# Patient Record
Sex: Female | Born: 1990 | Race: Black or African American | Hispanic: No | Marital: Single
Health system: Midwestern US, Community
[De-identification: ages and names within clinical notes are randomized; demographics above are authoritative.]

## PROBLEM LIST (undated history)

## (undated) DIAGNOSIS — N159 Renal tubulo-interstitial disease, unspecified: Secondary | ICD-10-CM

## (undated) DIAGNOSIS — I10 Essential (primary) hypertension: Secondary | ICD-10-CM

## (undated) DIAGNOSIS — K859 Acute pancreatitis without necrosis or infection, unspecified: Secondary | ICD-10-CM

## (undated) HISTORY — PX: CHOLECYSTECTOMY: SHX55

---

## 2014-04-07 LAB — URINE MICROSCOPIC ONLY
RBC: 0 /hpf (ref 0–5)
WBC: 0 /hpf (ref 0–5)

## 2014-04-07 LAB — CBC WITH AUTOMATED DIFF
ABS. BASOPHILS: 0 10*3/uL (ref 0.0–0.06)
ABS. EOSINOPHILS: 0 10*3/uL (ref 0.0–0.4)
ABS. LYMPHOCYTES: 0.7 10*3/uL — ABNORMAL LOW (ref 0.9–3.6)
ABS. MONOCYTES: 0.7 10*3/uL (ref 0.05–1.2)
ABS. NEUTROPHILS: 14 10*3/uL — ABNORMAL HIGH (ref 1.8–8.0)
BASOPHILS: 0 % (ref 0–2)
EOSINOPHILS: 0 % (ref 0–5)
HCT: 40.7 % (ref 35.0–45.0)
HGB: 13.7 g/dL (ref 12.0–16.0)
LYMPHOCYTES: 5 % — ABNORMAL LOW (ref 21–52)
MCH: 26.8 PG (ref 24.0–34.0)
MCHC: 33.7 g/dL (ref 31.0–37.0)
MCV: 79.6 FL (ref 74.0–97.0)
MONOCYTES: 5 % (ref 3–10)
MPV: 9.6 FL (ref 9.2–11.8)
NEUTROPHILS: 90 % — ABNORMAL HIGH (ref 40–73)
PLATELET: 255 10*3/uL (ref 135–420)
RBC: 5.11 M/uL (ref 4.20–5.30)
RDW: 13.5 % (ref 11.6–14.5)
WBC: 15.4 10*3/uL — ABNORMAL HIGH (ref 4.6–13.2)

## 2014-04-07 LAB — METABOLIC PANEL, COMPREHENSIVE
A-G Ratio: 1 (ref 0.8–1.7)
ALT (SGPT): 19 U/L (ref 13–56)
AST (SGOT): 14 U/L — ABNORMAL LOW (ref 15–37)
Albumin: 4 g/dL (ref 3.4–5.0)
Alk. phosphatase: 77 U/L (ref 45–117)
Anion gap: 15 mmol/L (ref 3.0–18)
BUN/Creatinine ratio: 13 (ref 12–20)
BUN: 13 MG/DL (ref 7.0–18)
Bilirubin, total: 0.5 MG/DL (ref 0.2–1.0)
CO2: 22 mmol/L (ref 21–32)
Calcium: 9.3 MG/DL (ref 8.5–10.1)
Chloride: 103 mmol/L (ref 100–108)
Creatinine: 1.03 MG/DL (ref 0.6–1.3)
GFR est AA: >60 mL/min/{1.73_m2} (ref >60)
GFR est non-AA: >60 mL/min/{1.73_m2} (ref >60)
Globulin: 4 g/dL (ref 2.0–4.0)
Glucose: 96 mg/dL (ref 74–99)
Potassium: 3.5 mmol/L (ref 3.5–5.5)
Protein, total: 8 g/dL (ref 6.4–8.2)
Sodium: 140 mmol/L (ref 136–145)

## 2014-04-07 LAB — URINALYSIS W/ RFLX MICROSCOPIC
Bilirubin: NEGATIVE
Blood: NEGATIVE
Glucose: NEGATIVE mg/dL
Ketone: 15 mg/dL — AB
Leukocyte Esterase: NEGATIVE
Nitrites: NEGATIVE
Specific gravity: 1.015 (ref 1.003–1.030)
Urobilinogen: 0.2 EU/dL (ref 0.2–1.0)
pH (UA): 8.5 — ABNORMAL HIGH (ref 5.0–8.0)

## 2014-04-07 LAB — LIPASE: Lipase: 283 U/L (ref 73–393)

## 2014-04-07 LAB — HCG URINE, QL. - POC: Pregnancy test,urine (POC): NEGATIVE

## 2014-04-07 MED ORDER — KETOROLAC TROMETHAMINE 30 MG/ML INJECTION
30 mg/mL (1 mL) | INTRAMUSCULAR | Status: AC
Start: 2014-04-07 — End: 2014-04-07
  Administered 2014-04-07: 20:00:00 via INTRAVENOUS

## 2014-04-07 MED ORDER — HYDROMORPHONE (PF) 1 MG/ML IJ SOLN
1 mg/mL | INTRAMUSCULAR | Status: AC
Start: 2014-04-07 — End: 2014-04-07
  Administered 2014-04-07: 20:00:00 via INTRAVENOUS

## 2014-04-07 MED ORDER — DICYCLOMINE 10 MG CAP
10 mg | ORAL_CAPSULE | Freq: Four times a day (QID) | ORAL | Status: AC
Start: 2014-04-07 — End: 2014-04-12

## 2014-04-07 MED ORDER — IOPAMIDOL 61 % IV SOLN
300 mg iodine /mL (61 %) | Freq: Once | INTRAVENOUS | Status: AC
Start: 2014-04-07 — End: 2014-04-07
  Administered 2014-04-07: 22:00:00 via INTRAVENOUS

## 2014-04-07 MED ORDER — ONDANSETRON 4 MG TAB, RAPID DISSOLVE
4 mg | ORAL_TABLET | Freq: Three times a day (TID) | ORAL | Status: AC | PRN
Start: 2014-04-07 — End: ?

## 2014-04-07 MED ORDER — HYDROMORPHONE (PF) 1 MG/ML IJ SOLN
1 mg/mL | INTRAMUSCULAR | Status: AC
Start: 2014-04-07 — End: 2014-04-07
  Administered 2014-04-07: 21:00:00 via INTRAVENOUS

## 2014-04-07 MED ORDER — ONDANSETRON (PF) 4 MG/2 ML INJECTION
4 mg/2 mL | INTRAMUSCULAR | Status: AC
Start: 2014-04-07 — End: 2014-04-07
  Administered 2014-04-07: 20:00:00 via INTRAVENOUS

## 2014-04-07 MED ADMIN — sodium chloride 0.9 % bolus infusion 1,000 mL: INTRAVENOUS | @ 21:00:00 | NDC 00409798309

## 2014-04-07 MED ADMIN — sodium chloride 0.9 % bolus infusion 1,000 mL: INTRAVENOUS | @ 19:00:00 | NDC 00409798309

## 2014-04-07 MED FILL — ISOVUE-300  61 % INTRAVENOUS SOLUTION: 300 mg iodine /mL (61 %) | INTRAVENOUS | Qty: 100

## 2014-04-07 MED FILL — HYDROMORPHONE (PF) 1 MG/ML IJ SOLN: 1 mg/mL | INTRAMUSCULAR | Qty: 1

## 2014-04-07 MED FILL — KETOROLAC TROMETHAMINE 30 MG/ML INJECTION: 30 mg/mL (1 mL) | INTRAMUSCULAR | Qty: 1

## 2014-04-07 MED FILL — SODIUM CHLORIDE 0.9 % IV: INTRAVENOUS | Qty: 1000

## 2014-04-07 MED FILL — ONDANSETRON (PF) 4 MG/2 ML INJECTION: 4 mg/2 mL | INTRAMUSCULAR | Qty: 2

## 2014-04-07 NOTE — ED Provider Notes (Signed)
HPI Comments:   3:03 PM  23 y.o. female presents to ED C/O abd pain onset 10 hours ago that has been gradually worsening while at work. Associated sxs include nausea, vomiting 8-9x, diarrhea, and cp which has since resolved. PMHx includes pancreatitis 8 years ago and bile duct obstruction, for which she had an ERCP 7 months ago. Pt was admitted to the hospital 28x last year for the same sxs. LNMP was 2 weeks ago. Patient denies daily medications, chance of pregnancy, or any other symptoms or complaints.    Patient is a 23 y.o. female presenting with abdominal pain.   Abdominal Pain   This is a recurrent problem. The current episode started 6 to 12 hours ago (10 hours ago). Associated symptoms include diarrhea, nausea, vomiting and chest pain. Pertinent negatives include no fever, no dysuria, no headaches, no arthralgias and no myalgias. Past workup includes surgery (ERCP) and esophagogastroduodenoscopy. Her past medical history is significant for pancreatitis.      Written by Stormy Card, ED Scribe, as dictated by Sherilyn Banker, PA-C     History reviewed. No pertinent past medical history.     History reviewed. No pertinent past surgical history.      History reviewed. No pertinent family history.     History     Social History   ??? Marital Status: SINGLE     Spouse Name: N/A     Number of Children: N/A   ??? Years of Education: N/A     Occupational History   ??? Not on file.     Social History Main Topics   ??? Smoking status: Not on file   ??? Smokeless tobacco: Not on file   ??? Alcohol Use: Not on file   ??? Drug Use: Not on file   ??? Sexual Activity: Not on file     Other Topics Concern   ??? Not on file     Social History Narrative   ??? No narrative on file                  ALLERGIES: Amoxicillin      Review of Systems   Constitutional: Negative for fever and fatigue.   HENT: Negative for rhinorrhea and sore throat.    Respiratory: Negative for cough and shortness of breath.     Cardiovascular: Positive for chest pain. Negative for palpitations.   Gastrointestinal: Positive for nausea, vomiting, abdominal pain and diarrhea.   Genitourinary: Negative for dysuria and difficulty urinating.   Musculoskeletal: Negative for myalgias and arthralgias.   Skin: Negative for color change and rash.   Neurological: Negative for light-headedness and headaches.       Filed Vitals:    04/07/14 1615 04/07/14 1715 04/07/14 1915 04/07/14 1922   BP: 120/74  107/56 103/63   Pulse: 71 81 73 73   Temp:       Resp: '15 14 23 16   ' Height:       Weight:       SpO2:  100% 100% 100%            Physical Exam   Vital signs and nursing notes reviewed.    CONSTITUTIONAL: Alert. Nontoxic-appearing; well-nourished; anxious, tearful in moderate pain distress.  HEAD: Normocephalic; atraumatic.  EYES: Conjunctiva clear.   ENT:  Moist mucus membranes.  NECK: Supple; FROM without difficulty, non-tender; no cervical lymphadenopathy.             CV: Normal S1, S2; no murmurs, rubs, or  gallops. No chest wall tenderness.  RESPIRATORY: Normal chest excursion with respiration; breath sounds clear and equal bilaterally; no wheezes, rhonchi, or rales.  GI: Normal bowel sounds; non-distended; +TTP in epigastric area and LUQ, rest of abdomen nontender; no guarding or rigidity; no palpable organomegaly. No CVA tenderness.   BACK:  No evidence of trauma or deformity. Non-tender to palpation.   EXT: Normal ROM in all four extremities; non-tender to palpation.  SKIN: Normal for age and race; warm; dry; good turgor; no apparent lesions or exudate.  NEURO: A & O x3.   PSYCH:  Mood and affect appropriate.       RESULTS:    X-RAY FINDINGS:  3:21 PM  Abd X-ray shows NAD.  Pending review by Radiologist  Recorded by Salley Slaughter, ED Scribe, as dictated by Sherilyn Banker, PA-C     CT ABD PELV W CONT   Final Result   IMPRESSION:  No definite acute inflammatory process in the abdomen or pelvis. Subtle areas of   heterogeneous attenuation in the mesentery is nonspecific and potentially  artifactual. A subtle degree of mesenteric edema not entirely excluded. Trace  ascites in the pelvis is most likely physiologic.   XR ABD ACUTE W 1 V CHEST    (Results Pending)        Labs Reviewed   URINALYSIS W/ RFLX MICROSCOPIC - Abnormal; Notable for the following:     pH (UA) 8.5 (*)     Protein TRACE (*)     Ketone 15 (*)     All other components within normal limits   CBC WITH AUTOMATED DIFF - Abnormal; Notable for the following:     WBC 15.4 (*)     NEUTROPHILS 90 (*)     LYMPHOCYTES 5 (*)     ABS. NEUTROPHILS 14.0 (*)     ABS. LYMPHOCYTES 0.7 (*)     All other components within normal limits   METABOLIC PANEL, COMPREHENSIVE - Abnormal; Notable for the following:     AST 14 (*)     All other components within normal limits   URINE MICROSCOPIC ONLY - Abnormal; Notable for the following:     Bacteria 1+ (*)     Mucus FEW (*)     All other components within normal limits   LIPASE   HCG URINE, QL. - POC   POC URINE PREGNANCY TEST       Recent Results (from the past 12 hour(s))   CBC WITH AUTOMATED DIFF    Collection Time: 04/07/14  3:09 PM   Result Value Ref Range    WBC 15.4 (H) 4.6 - 13.2 K/uL    RBC 5.11 4.20 - 5.30 M/uL    HGB 13.7 12.0 - 16.0 g/dL    HCT 40.7 35.0 - 45.0 %    MCV 79.6 74.0 - 97.0 FL    MCH 26.8 24.0 - 34.0 PG    MCHC 33.7 31.0 - 37.0 g/dL    RDW 13.5 11.6 - 14.5 %    PLATELET 255 135 - 420 K/uL    MPV 9.6 9.2 - 11.8 FL    NEUTROPHILS 90 (H) 40 - 73 %    LYMPHOCYTES 5 (L) 21 - 52 %    MONOCYTES 5 3 - 10 %    EOSINOPHILS 0 0 - 5 %    BASOPHILS 0 0 - 2 %    ABS. NEUTROPHILS 14.0 (H) 1.8 - 8.0 K/UL    ABS. LYMPHOCYTES 0.7 (L)  0.9 - 3.6 K/UL    ABS. MONOCYTES 0.7 0.05 - 1.2 K/UL    ABS. EOSINOPHILS 0.0 0.0 - 0.4 K/UL    ABS. BASOPHILS 0.0 0.0 - 0.06 K/UL    DF AUTOMATED     METABOLIC PANEL, COMPREHENSIVE    Collection Time: 04/07/14  3:09 PM   Result Value Ref Range    Sodium 140 136 - 145 mmol/L     Potassium 3.5 3.5 - 5.5 mmol/L    Chloride 103 100 - 108 mmol/L    CO2 22 21 - 32 mmol/L    Anion gap 15 3.0 - 18 mmol/L    Glucose 96 74 - 99 mg/dL    BUN 13 7.0 - 18 MG/DL    Creatinine 1.03 0.6 - 1.3 MG/DL    BUN/Creatinine ratio 13 12 - 20      GFR est AA >60 >60 ml/min/1.22m    GFR est non-AA >60 >60 ml/min/1.759m   Calcium 9.3 8.5 - 10.1 MG/DL    Bilirubin, total 0.5 0.2 - 1.0 MG/DL    ALT 19 13 - 56 U/L    AST 14 (L) 15 - 37 U/L    Alk. phosphatase 77 45 - 117 U/L    Protein, total 8.0 6.4 - 8.2 g/dL    Albumin 4.0 3.4 - 5.0 g/dL    Globulin 4.0 2.0 - 4.0 g/dL    A-G Ratio 1.0 0.8 - 1.7     LIPASE    Collection Time: 04/07/14  3:09 PM   Result Value Ref Range    Lipase 283 73 - 393 U/L   URINALYSIS W/ RFLX MICROSCOPIC    Collection Time: 04/07/14  3:15 PM   Result Value Ref Range    Color YELLOW      Appearance HAZY      Specific gravity 1.015 1.003 - 1.030      pH (UA) 8.5 (H) 5.0 - 8.0      Protein TRACE (A) NEG mg/dL    Glucose NEGATIVE  NEG mg/dL    Ketone 15 (A) NEG mg/dL    Bilirubin NEGATIVE  NEG      Blood NEGATIVE  NEG      Urobilinogen 0.2 0.2 - 1.0 EU/dL    Nitrites NEGATIVE  NEG      Leukocyte Esterase NEGATIVE  NEG     URINE MICROSCOPIC ONLY    Collection Time: 04/07/14  3:15 PM   Result Value Ref Range    WBC 0 to 1 0 - 5 /hpf    RBC 0 to 1 0 - 5 /hpf    Epithelial cells 4+ 0 - 5 /lpf    Bacteria 1+ (A) NEG /hpf    Mucus FEW (A) NEG /lpf   HCG URINE, QL. - POC    Collection Time: 04/07/14  3:18 PM   Result Value Ref Range    Pregnancy test,urine (POC) NEGATIVE  NEG          MDM  Number of Diagnoses or Management Options     Amount and/or Complexity of Data Reviewed  Clinical lab tests: ordered and reviewed  Tests in the radiology section of CPT??: ordered and reviewed (CT Abd Pelvic and Abd X-ray)  Independent visualization of images, tracings, or specimens: yes (Abd X-ray)        MEDICATIONS GIVEN:  Medications   sodium chloride 0.9 % bolus infusion 1,000 mL (0 mL IntraVENous IV  Completed 04/07/14 1658)   ondansetron (ZOFRAN)  injection 4 mg (4 mg IntraVENous Given 04/07/14 1532)   ketorolac (TORADOL) injection 30 mg (30 mg IntraVENous Given 04/07/14 1531)   HYDROmorphone (PF) (DILAUDID) injection 1 mg (1 mg IntraVENous Given 04/07/14 1550)   iopamidol (ISOVUE 300) 61 % contrast injection 100 mL (100 mL IntraVENous Given 04/07/14 1736)   sodium chloride 0.9 % bolus infusion 1,000 mL (0 mL IntraVENous IV Completed 04/07/14 1929)   HYDROmorphone (PF) (DILAUDID) injection 1 mg (1 mg IntraVENous Given 04/07/14 1658)        Procedures    PROGRESS NOTE:  3:03 PM  Initial assessment performed.  Written by Stormy Card, ED Scribe, as dictated by Sherilyn Banker, PA-C    PROGRESS NOTE:   4:48 PM  Received a call from an RN at Pt's GI doctor's office in [place]. She informed me that pt was last seen by them on 05/28/2013, had ERCP with sphincterotomy and EGD with biopsy which was normal. 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 They have not seen her since this visit.   Written by Salley Slaughter, ED Scribe, as dictated by Sherilyn Banker, PA-C.     PROGRESS NOTE:   7:22 PM  Pt is feeling much better, her pain has resolved, she???s no longer vomiting, and has a GI specialist appointment.  Written by Salley Slaughter, ED Scribe, as dictated by Sherilyn Banker, PA-C.     DISCHARGE NOTE:  7:23 PM   Qamar Kempner's  results have been reviewed with her.  She has been counseled regarding her diagnosis, treatment, and plan.  She verbally conveys understanding and agreement of the signs, symptoms, diagnosis, treatment and prognosis and additionally agrees to follow up as discussed.  She also agrees with the care-plan and conveys that all of her questions have been answered.  I have also provided discharge instructions for her that include: educational information regarding their diagnosis and treatment, and list of reasons why they would want to return to the ED  prior to their follow-up appointment, should her condition change.    CLINICAL IMPRESSION:    1. Abdominal pain, LUQ (left upper quadrant)    2. N&V (nausea and vomiting)    3. Diarrhea        PLAN: DISCHARGE HOME    Follow-up Information     Follow up With Details Comments Contact Info    M Everlean Alstrom, MD  Follow up with your gastroenterologist. [place] [address]  [phone]      [facility] EMERGENCY DEPT  As needed, If symptoms worsen [address]  [place] [address]  [phone]          Discharge Medication List as of 04/07/2014  7:09 PM      START taking these medications    Details   dicyclomine (BENTYL) 10 mg capsule Take 1 Cap by mouth four (4) times daily for 5 days., Print, Disp-20 Cap, R-0      ondansetron (ZOFRAN ODT) 4 mg disintegrating tablet Take 1 Tab by mouth every eight (8) hours as needed for Nausea., Print, Disp-12 Tab, R-0             Written by Stormy Card, ED Scribe, as dictated by Sherilyn Banker, PA-C.       I agree with the above documentation as written by the scribe.  Sherilyn Banker, PA-C

## 2014-04-07 NOTE — ED Notes (Signed)
Severe left upper quadrant pain started at approx 5 am, nausea and vomiting;  2 episodes of diarrhea;  Numerous episodes like this in the past, unknown cause;  Sees gastroenterologist in [place];  States had been admitted 28 times in the past

## 2014-04-07 NOTE — Progress Notes (Signed)
Called nadine to send patient for CT of Abd Pel -- 17:25

## 2014-04-07 NOTE — ED Notes (Signed)
I have reviewed discharge instructions with the patient.  The patient verbalized understanding. Prescriptions given. Patient ambulated without difficulties to lobby with friends.

## 2018-08-29 ENCOUNTER — Emergency Department (HOSPITAL_COMMUNITY): Payer: Medicaid - Out of State

## 2018-08-29 ENCOUNTER — Other Ambulatory Visit: Payer: Self-pay

## 2018-08-29 ENCOUNTER — Inpatient Hospital Stay (HOSPITAL_COMMUNITY)
Admission: EM | Admit: 2018-08-29 | Discharge: 2018-09-03 | DRG: 439 | Discharge status: Against Medical Advice | Payer: Medicaid - Out of State | Attending: Internal Medicine | Admitting: Internal Medicine

## 2018-08-29 ENCOUNTER — Encounter (HOSPITAL_COMMUNITY): Payer: Self-pay

## 2018-08-29 DIAGNOSIS — F319 Bipolar disorder, unspecified: Secondary | ICD-10-CM | POA: Diagnosis present

## 2018-08-29 DIAGNOSIS — Z87442 Personal history of urinary calculi: Secondary | ICD-10-CM

## 2018-08-29 DIAGNOSIS — R112 Nausea with vomiting, unspecified: Secondary | ICD-10-CM

## 2018-08-29 DIAGNOSIS — K920 Hematemesis: Secondary | ICD-10-CM | POA: Diagnosis present

## 2018-08-29 DIAGNOSIS — F1290 Cannabis use, unspecified, uncomplicated: Secondary | ICD-10-CM | POA: Diagnosis present

## 2018-08-29 DIAGNOSIS — K859 Acute pancreatitis without necrosis or infection, unspecified: Principal | ICD-10-CM

## 2018-08-29 DIAGNOSIS — Z79891 Long term (current) use of opiate analgesic: Secondary | ICD-10-CM

## 2018-08-29 DIAGNOSIS — K861 Other chronic pancreatitis: Secondary | ICD-10-CM | POA: Diagnosis present

## 2018-08-29 DIAGNOSIS — I10 Essential (primary) hypertension: Secondary | ICD-10-CM | POA: Diagnosis present

## 2018-08-29 DIAGNOSIS — R4182 Altered mental status, unspecified: Secondary | ICD-10-CM | POA: Diagnosis present

## 2018-08-29 DIAGNOSIS — Z79899 Other long term (current) drug therapy: Secondary | ICD-10-CM

## 2018-08-29 DIAGNOSIS — Z9049 Acquired absence of other specified parts of digestive tract: Secondary | ICD-10-CM

## 2018-08-29 DIAGNOSIS — E876 Hypokalemia: Secondary | ICD-10-CM | POA: Diagnosis present

## 2018-08-29 DIAGNOSIS — N200 Calculus of kidney: Secondary | ICD-10-CM | POA: Diagnosis present

## 2018-08-29 DIAGNOSIS — G40909 Epilepsy, unspecified, not intractable, without status epilepticus: Secondary | ICD-10-CM | POA: Diagnosis present

## 2018-08-29 DIAGNOSIS — F17210 Nicotine dependence, cigarettes, uncomplicated: Secondary | ICD-10-CM | POA: Diagnosis present

## 2018-08-29 DIAGNOSIS — Z881 Allergy status to other antibiotic agents status: Secondary | ICD-10-CM

## 2018-08-29 DIAGNOSIS — F1910 Other psychoactive substance abuse, uncomplicated: Secondary | ICD-10-CM | POA: Diagnosis present

## 2018-08-29 DIAGNOSIS — K8681 Exocrine pancreatic insufficiency: Secondary | ICD-10-CM | POA: Diagnosis present

## 2018-08-29 DIAGNOSIS — R1012 Left upper quadrant pain: Secondary | ICD-10-CM

## 2018-08-29 HISTORY — DX: Renal tubulo-interstitial disease, unspecified: N15.9

## 2018-08-29 HISTORY — DX: Essential (primary) hypertension: I10

## 2018-08-29 HISTORY — DX: Acute pancreatitis without necrosis or infection, unspecified: K85.90

## 2018-08-29 LAB — COMPREHENSIVE METABOLIC PANEL
ALT: 30 U/L (ref 0–44)
AST: 39 U/L (ref 15–41)
Albumin: 5.2 g/dL — ABNORMAL HIGH (ref 3.5–5.0)
Alkaline Phosphatase: 71 U/L (ref 38–126)
Anion gap: 15 (ref 5–15)
BUN: 12 mg/dL (ref 6–20)
CO2: 21 mmol/L — ABNORMAL LOW (ref 22–32)
CREATININE: 1.16 mg/dL — AB (ref 0.44–1.00)
Calcium: 10 mg/dL (ref 8.9–10.3)
Chloride: 100 mmol/L (ref 98–111)
GFR calc Af Amer: >60 mL/min (ref >60)
GFR calc non Af Amer: >60 mL/min (ref >60)
Glucose, Bld: 78 mg/dL (ref 70–99)
Potassium: 3.2 mmol/L — ABNORMAL LOW (ref 3.5–5.1)
Sodium: 136 mmol/L (ref 135–145)
Total Bilirubin: 0.7 mg/dL (ref 0.3–1.2)
Total Protein: 9.3 g/dL — ABNORMAL HIGH (ref 6.5–8.1)

## 2018-08-29 LAB — SALICYLATE LEVEL: Salicylate Lvl: <7 mg/dL (ref 2.8–30.0)

## 2018-08-29 LAB — CBC WITH DIFFERENTIAL/PLATELET
Abs Immature Granulocytes: 0.02 10*3/uL (ref 0.00–0.07)
Basophils Absolute: 0.1 10*3/uL (ref 0.0–0.1)
Basophils Relative: 1 %
Eosinophils Absolute: 0 10*3/uL (ref 0.0–0.5)
Eosinophils Relative: 0 %
HCT: 44.5 % (ref 36.0–46.0)
HEMOGLOBIN: 13.5 g/dL (ref 12.0–15.0)
Immature Granulocytes: 0 %
Lymphocytes Relative: 19 %
Lymphs Abs: 2 10*3/uL (ref 0.7–4.0)
MCH: 22.7 pg — ABNORMAL LOW (ref 26.0–34.0)
MCHC: 30.3 g/dL (ref 30.0–36.0)
MCV: 74.8 fL — ABNORMAL LOW (ref 80.0–100.0)
Monocytes Absolute: 0.9 10*3/uL (ref 0.1–1.0)
Monocytes Relative: 8 %
NRBC: 0 % (ref 0.0–0.2)
Neutro Abs: 7.8 10*3/uL — ABNORMAL HIGH (ref 1.7–7.7)
Neutrophils Relative %: 72 %
Platelets: 316 10*3/uL (ref 150–400)
RBC: 5.95 MIL/uL — AB (ref 3.87–5.11)
RDW: 14.7 % (ref 11.5–15.5)
WBC: 10.7 10*3/uL — ABNORMAL HIGH (ref 4.0–10.5)

## 2018-08-29 LAB — ACETAMINOPHEN LEVEL: Acetaminophen (Tylenol), Serum: <10 ug/mL — ABNORMAL LOW (ref 10–30)

## 2018-08-29 LAB — LIPASE, BLOOD: Lipase: 85 U/L — ABNORMAL HIGH (ref 11–51)

## 2018-08-29 LAB — ETHANOL

## 2018-08-29 MED ORDER — PROMETHAZINE HCL 25 MG/ML IJ SOLN
INTRAMUSCULAR | Status: AC
  Administered 2018-08-29: 12.5 mg via INTRAVENOUS
  Filled 2018-08-29: qty 1

## 2018-08-29 MED ORDER — POTASSIUM CHLORIDE IN NACL 20-0.9 MEQ/L-% IV SOLN: INTRAVENOUS | Status: DC

## 2018-08-29 MED ORDER — FAMOTIDINE IN NACL 20-0.9 MG/50ML-% IV SOLN
20.0000 mg | Freq: Two times a day (BID) | INTRAVENOUS | Status: DC
  Administered 2018-08-30 (×2): 20 mg via INTRAVENOUS
  Filled 2018-08-29 (×2): qty 50

## 2018-08-29 MED ORDER — HEPARIN SODIUM (PORCINE) 5000 UNIT/ML IJ SOLN
5000.0000 [IU] | Freq: Three times a day (TID) | INTRAMUSCULAR | Status: DC
  Administered 2018-08-30 – 2018-09-03 (×12): 5000 [IU] via SUBCUTANEOUS
  Filled 2018-08-29 (×12): qty 1

## 2018-08-29 MED ORDER — POTASSIUM CHLORIDE IN NACL 20-0.45 MEQ/L-% IV SOLN
INTRAVENOUS | Status: DC
  Filled 2018-08-29: qty 1000

## 2018-08-29 MED ORDER — ONDANSETRON HCL 4 MG/2ML IJ SOLN
4.0000 mg | Freq: Once | INTRAMUSCULAR | Status: AC
  Administered 2018-08-29: 4 mg via INTRAVENOUS
  Filled 2018-08-29: qty 2

## 2018-08-29 MED ORDER — ACETAMINOPHEN 325 MG PO TABS: 650.0000 mg | ORAL_TABLET | Freq: Four times a day (QID) | ORAL | Status: DC | PRN

## 2018-08-29 MED ORDER — ONDANSETRON HCL 4 MG PO TABS: 4.0000 mg | ORAL_TABLET | Freq: Four times a day (QID) | ORAL | Status: DC | PRN

## 2018-08-29 MED ORDER — SODIUM CHLORIDE 0.9 % IV BOLUS
1000.0000 mL | Freq: Once | INTRAVENOUS | Status: AC
  Administered 2018-08-29: 1000 mL via INTRAVENOUS

## 2018-08-29 MED ORDER — POTASSIUM CHLORIDE IN NACL 20-0.9 MEQ/L-% IV SOLN
INTRAVENOUS | Status: DC
  Administered 2018-08-30 – 2018-09-02 (×11): via INTRAVENOUS

## 2018-08-29 MED ORDER — ONDANSETRON HCL 4 MG/2ML IJ SOLN
4.0000 mg | Freq: Four times a day (QID) | INTRAMUSCULAR | Status: DC | PRN
  Administered 2018-08-30 – 2018-09-02 (×5): 4 mg via INTRAVENOUS
  Filled 2018-08-29 (×5): qty 2

## 2018-08-29 MED ORDER — ACETAMINOPHEN 650 MG RE SUPP: 650.0000 mg | Freq: Four times a day (QID) | RECTAL | Status: DC | PRN

## 2018-08-29 MED ORDER — PROMETHAZINE HCL 25 MG/ML IJ SOLN
12.5000 mg | Freq: Once | INTRAMUSCULAR | Status: AC
  Administered 2018-08-29: 12.5 mg via INTRAVENOUS

## 2018-08-29 NOTE — ED Notes (Signed)
Pt vomiting EDP ordered nausea medication
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
## 2018-08-29 NOTE — H&P (Signed)
History and Physical    Stephanie Drake ZOX:[id number] DOB: 07/18/1999 DOA: 08/29/2018  PCP: Patient, No Pcp Per   Patient coming from: Home.  I have personally briefly reviewed patient's [facility]  Chief Complaint: AMS.  HPI: Stephanie Drake is a 28 y.o. female with medical history significant of hypertension, seizure disorder, bipolar disorder, urolithiasis, chronic pancreatitis who is coming to the emergency department with complaints of fever with vomiting for the past 4 days.  She went to the [facility] ED and was diagnosed with a renal infection and given Bactrim and tramadol.  The patient's stepmother stated that she has not been able to do much, but has had further vomiting.  She has not been talking much and all of the history is given by her stepmother.  ED Course: Initial vital signs temperature 99.2 F, pulse 107, respirations 18, blood pressure 161/123 mmHg and O2 sat 99% on room air.  The patient received a 1000 mL NS bolus, Zofran 4 mg and Phenergan 12.5 mg IVP.  White count was 10.7, hemoglobin 13.5 g/dL and platelets [id number].  CMP shows a potassium of 3.2 and CO2 21 mmol/L.  All other electrolytes are normal.  Creatinine was 1.16 mg/dL.  Total protein 9.3 and albumin 5.2 g/dL.  LFTs are within normal limits.  Lipase was 85 units/L.  Alcohol, salicylate and acetaminophen are within normal limits.  Imaging: Chest radiograph does not show any acute cardiopulmonary pathology.  CT head was normal.  Results from a CT abdomen/pelvis from [facility] show urolithiasis on the left.  Review of Systems: Unable to obtain.  The patient did not answer questions.  Her stepmother was someone providing the information.   Past Medical History:  Diagnosis Date  . Hypertension   . Kidney infection   . Pancreatitis     Past Surgical History:  Procedure Laterality Date  . CHOLECYSTECTOMY       reports that she has been smoking. She has been smoking about 0.50  packs per day. She has never used smokeless tobacco. She reports current drug use. Drug: Marijuana. She reports that she does not drink alcohol.  Allergies  Allergen Reactions  . Amoxicillin     Hives    Family medical history Unable to obtain.  Prior to Admission medications   Medication Sig Start Date End Date Taking? Authorizing Provider  sulfamethoxazole-trimethoprim (BACTRIM DS,SEPTRA DS) 800-160 MG tablet Take 1 tablet by mouth every 12 (twelve) hours. 08/29/18  Yes [provider]  traMADol (ULTRAM) 50 MG tablet Take 1 tablet by mouth 2 (two) times daily. 08/29/18  Yes [provider]    Physical Exam: Vitals:   08/29/18 2102 08/29/18 2215 08/29/18 2230 08/29/18 2300  BP: (!) 172/100 (!) 165/118 (!) 144/106 (!) 148/107  Pulse: 99  (!) 104 (!) 102  Resp: 20 17 16 20   Temp:      TempSrc:      SpO2: 100%  100% 97%  Weight:      Height:        Constitutional: NAD, calm, comfortable Eyes: PERRL, lids and conjunctivae normal ENMT: Mucous membranes are mildly dry.  Posterior pharynx clear of any exudate or lesions. Neck: normal, supple, no masses, no thyromegaly Respiratory: Decreased breath sounds in bases, but otherwise clear to auscultation bilaterally, no wheezing, no crackles. Normal respiratory effort. No accessory muscle use.  Cardiovascular: Tachycardic at 102 bpm, no murmurs / rubs / gallops. No extremity edema. 2+ pedal pulses. No carotid bruits.  Abdomen: Soft, mild epigastric tenderness, no guarding or rebound, no masses palpated. No hepatosplenomegaly. Bowel sounds positive.  Musculoskeletal: no clubbing / cyanosis.  Good ROM, no contractures. Normal muscle tone.  Skin: no gross rashes, lesions, ulcers. No induration on limited dermatological examination. Neurologic: Moves all extremities. Psychiatric: Somnolent.  Opens eyes briefly.  Does not answer questions.   Labs on Admission: I have personally reviewed following labs and imaging  studies  CBC: Recent Labs  Lab 08/29/18 1817  WBC 10.7*  NEUTROABS 7.8*  HGB 13.5  HCT 44.5  MCV 74.8*  PLT 316   Basic Metabolic Panel: Recent Labs  Lab 08/29/18 1817  NA 136  K 3.2*  CL 100  CO2 21*  GLUCOSE 78  BUN 12  CREATININE 1.16*  CALCIUM 10.0   GFR: Estimated Creatinine Clearance: 72.4 mL/min (A) (by C-G formula based on SCr of 1.16 mg/dL (H)). Liver Function Tests: Recent Labs  Lab 08/29/18 1817  AST 39  ALT 30  ALKPHOS 71  BILITOT 0.7  PROT 9.3*  ALBUMIN 5.2*   Recent Labs  Lab 08/29/18 1817  LIPASE 85*   No results for input(s): AMMONIA in the last 168 hours. Coagulation Profile: No results for input(s): INR, PROTIME in the last 168 hours. Cardiac Enzymes: No results for input(s): CKTOTAL, CKMB, CKMBINDEX, TROPONINI in the last 168 hours. BNP (last 3 results) No results for input(s): PROBNP in the last 8760 hours. HbA1C: No results for input(s): HGBA1C in the last 72 hours. CBG: No results for input(s): GLUCAP in the last 168 hours. Lipid Profile: No results for input(s): CHOL, HDL, LDLCALC, TRIG, CHOLHDL, LDLDIRECT in the last 72 hours. Thyroid Function Tests: No results for input(s): TSH, T4TOTAL, FREET4, T3FREE, THYROIDAB in the last 72 hours. Anemia Panel: No results for input(s): VITAMINB12, FOLATE, FERRITIN, TIBC, IRON, RETICCTPCT in the last 72 hours. Urine analysis: No results found for: COLORURINE, APPEARANCEUR, LABSPEC, PHURINE, GLUCOSEU, HGBUR, BILIRUBINUR, KETONESUR, PROTEINUR, UROBILINOGEN, NITRITE, LEUKOCYTESUR  Radiological Exams on Admission: Ct Head Wo Contrast  Result Date: 08/29/2018 CLINICAL DATA:  Headache, dizziness and lethargy for 4 days. Fever and vomiting. History of kidney infection. EXAM: CT HEAD WITHOUT CONTRAST TECHNIQUE: Contiguous axial images were obtained from the base of the skull through the vertex without intravenous contrast. COMPARISON:  None. FINDINGS: BRAIN: No intraparenchymal hemorrhage, mass  effect nor midline shift. The ventricles and sulci are normal. No acute large vascular territory infarcts. No abnormal extra-axial fluid collections. Basal cisterns are patent. VASCULAR: Unremarkable. SKULL/SOFT TISSUES: No skull fracture. No significant soft tissue swelling. ORBITS/SINUSES: The included ocular globes and orbital contents are normal.Trace paranasal sinus mucosal thickening. Mastoid air cells are well aerated. OTHER: None. IMPRESSION: Normal CT HEAD without contrast. Electronically Signed   By: Awilda Metro M.D.   On: 08/29/2018 20:34   Dg Abdomen Acute W/chest  Result Date: 08/29/2018 CLINICAL DATA:  Fever and vomiting for the past 4 days. EXAM: DG ABDOMEN ACUTE W/ 1V CHEST COMPARISON:  None. FINDINGS: There is no evidence of dilated bowel loops or free intraperitoneal air. 3 mm calcification in the lower pole of the left kidney is noted. Heart size and mediastinal contours are within normal limits. Both lungs are clear. IMPRESSION: Unremarkable bowel gas pattern. A 3 mm calcification projects over the lower pole the left kidney suspicious for left renal calculus. No acute cardiopulmonary disease. Electronically Signed   By: Tollie Eth M.D.   On: 08/29/2018 21:06    EKG: Independently reviewed.  Vent. rate 95 BPM  PR interval * ms QRS duration 82 ms QT/QTc 364/458 ms P-R-T axes -84 76 66 Ectopic atrial rhythm Consider left atrial enlargement Borderline T abnormalities, anterior leads  Assessment/Plan Principal Problem:   Acute pancreatitis Observation/telemetry. Keep n.p.o. Continue IV fluids. Analgesics as needed. Antiemetics as needed.  Active Problems:   Hypertension Monitor blood pressure. Hydralazine 10 mg every 4 hours PRN.    Hypokalemia Continue potassium supplementation. Follow-up level.    DVT prophylaxis: Heparin SQ. Code Status: Full code. Family Communication: Her stepmother was with her. Disposition Plan: Observation for IV hydration, K  replacement and pain control. Consults called:  Admission status: Observation/telemetry.   Bobette Moavid Manuel Ortiz MD [facility]  08/29/2018, 11:42 PM

## 2018-08-29 NOTE — ED Notes (Signed)
Patient's mother stepped in hallway stating that her daughter was having a seizure. RN Herbert Seta made aware. Upon entering room patient shaking and crying. Mother states that's a pre curser to her seizure activity. Dr Rubin Payor at bedside with patient. Patient still yelling out that it's not right that we are letting her sit here in pain. Seizure pads put on bed as a precaution.

## 2018-08-29 NOTE — ED Notes (Signed)
Pt mother called out stating her daughter was "having a seizure" When this nurse entered room, patient was laying on right side crying and shaking. Then patient stretched her legs out and rolled over onto her left side stating "it hurts, it hurts freaking bad" EDP at bedside

## 2018-08-29 NOTE — ED Notes (Signed)
Pt appears unresponsive until hand is raised over pt's head and she does not hit head when hand is released  She then opens her eyes and responds to queries

## 2018-08-29 NOTE — ED Notes (Signed)
Patient is very tearful at this time. States that she is in extreme pain at this time. Reminded patient that a urine specimen is till needed. Let patient know that an in and out cath is ordered. Family member states patient has had some trauma as a child and does not trust males.

## 2018-08-29 NOTE — ED Notes (Signed)
Pt at x-ray/CT at this time

## 2018-08-29 NOTE — ED Triage Notes (Signed)
Pt has had a fever and vomiting for the last 4 days. Was seen at [facility] yesterday and diagnosed with a kidney infection. History of Pancreatitis. Pt lethargic in triage. No fever noted today.

## 2018-08-30 ENCOUNTER — Encounter (HOSPITAL_COMMUNITY): Payer: Self-pay | Admitting: Internal Medicine

## 2018-08-30 DIAGNOSIS — E876 Hypokalemia: Secondary | ICD-10-CM

## 2018-08-30 DIAGNOSIS — I10 Essential (primary) hypertension: Secondary | ICD-10-CM | POA: Diagnosis not present

## 2018-08-30 DIAGNOSIS — K859 Acute pancreatitis without necrosis or infection, unspecified: Secondary | ICD-10-CM | POA: Diagnosis not present

## 2018-08-30 LAB — CBC WITH DIFFERENTIAL/PLATELET
Abs Immature Granulocytes: 0.03 10*3/uL (ref 0.00–0.07)
BASOS PCT: 1 %
Basophils Absolute: 0.1 10*3/uL (ref 0.0–0.1)
Eosinophils Absolute: 0 10*3/uL (ref 0.0–0.5)
Eosinophils Relative: 0 %
HCT: 42.8 % (ref 36.0–46.0)
Hemoglobin: 12.9 g/dL (ref 12.0–15.0)
Immature Granulocytes: 0 %
Lymphocytes Relative: 19 %
Lymphs Abs: 1.9 10*3/uL (ref 0.7–4.0)
MCH: 23.5 pg — ABNORMAL LOW (ref 26.0–34.0)
MCHC: 30.1 g/dL (ref 30.0–36.0)
MCV: 78 fL — ABNORMAL LOW (ref 80.0–100.0)
Monocytes Absolute: 1.4 10*3/uL — ABNORMAL HIGH (ref 0.1–1.0)
Monocytes Relative: 14 %
Neutro Abs: 6.7 10*3/uL (ref 1.7–7.7)
Neutrophils Relative %: 66 %
PLATELETS: 242 10*3/uL (ref 150–400)
RBC: 5.49 MIL/uL — ABNORMAL HIGH (ref 3.87–5.11)
RDW: 14.8 % (ref 11.5–15.5)
WBC: 10.1 10*3/uL (ref 4.0–10.5)
nRBC: 0 % (ref 0.0–0.2)

## 2018-08-30 LAB — COMPREHENSIVE METABOLIC PANEL
ALT: 23 U/L (ref 0–44)
AST: 26 U/L (ref 15–41)
Albumin: 3.9 g/dL (ref 3.5–5.0)
Alkaline Phosphatase: 53 U/L (ref 38–126)
Anion gap: 11 (ref 5–15)
BUN: 12 mg/dL (ref 6–20)
CO2: 20 mmol/L — ABNORMAL LOW (ref 22–32)
Calcium: 9.1 mg/dL (ref 8.9–10.3)
Chloride: 105 mmol/L (ref 98–111)
Creatinine, Ser: 1.12 mg/dL — ABNORMAL HIGH (ref 0.44–1.00)
GFR calc Af Amer: >60 mL/min (ref >60)
GFR calc non Af Amer: >60 mL/min (ref >60)
Glucose, Bld: 73 mg/dL (ref 70–99)
POTASSIUM: 3.6 mmol/L (ref 3.5–5.1)
Sodium: 136 mmol/L (ref 135–145)
Total Bilirubin: 0.5 mg/dL (ref 0.3–1.2)
Total Protein: 7.2 g/dL (ref 6.5–8.1)

## 2018-08-30 LAB — URINALYSIS, ROUTINE W REFLEX MICROSCOPIC
Bacteria, UA: NONE SEEN
Bilirubin Urine: NEGATIVE
Glucose, UA: NEGATIVE mg/dL
Ketones, ur: 80 mg/dL — AB
Leukocytes, UA: NEGATIVE
Nitrite: NEGATIVE
Protein, ur: 100 mg/dL — AB
Specific Gravity, Urine: 1.019 (ref 1.005–1.030)
pH: 5 (ref 5.0–8.0)

## 2018-08-30 LAB — RAPID URINE DRUG SCREEN, HOSP PERFORMED
Amphetamines: NOT DETECTED
Barbiturates: NOT DETECTED
Benzodiazepines: NOT DETECTED
Cocaine: POSITIVE — AB
Opiates: POSITIVE — AB
Tetrahydrocannabinol: POSITIVE — AB

## 2018-08-30 LAB — MAGNESIUM: Magnesium: 2.4 mg/dL (ref 1.7–2.4)

## 2018-08-30 LAB — PREGNANCY, URINE: Preg Test, Ur: NEGATIVE

## 2018-08-30 LAB — PHOSPHORUS: Phosphorus: 3.7 mg/dL (ref 2.5–4.6)

## 2018-08-30 LAB — LIPASE, BLOOD: Lipase: 48 U/L (ref 11–51)

## 2018-08-30 MED ORDER — PANTOPRAZOLE SODIUM 40 MG IV SOLR
40.0000 mg | INTRAVENOUS | Status: DC
  Administered 2018-08-30 – 2018-08-31 (×2): 40 mg via INTRAVENOUS
  Filled 2018-08-30 (×2): qty 40

## 2018-08-30 MED ORDER — PROMETHAZINE HCL 25 MG/ML IJ SOLN
12.5000 mg | Freq: Four times a day (QID) | INTRAMUSCULAR | Status: DC | PRN
  Administered 2018-08-30 – 2018-09-01 (×5): 12.5 mg via INTRAVENOUS
  Filled 2018-08-30 (×5): qty 1

## 2018-08-30 MED ORDER — HYDROMORPHONE HCL 1 MG/ML IJ SOLN
0.2500 mg | INTRAMUSCULAR | Status: DC | PRN
  Administered 2018-08-30 – 2018-09-02 (×16): 0.25 mg via INTRAVENOUS
  Filled 2018-08-30 (×18): qty 0.5

## 2018-08-30 MED ORDER — HYDRALAZINE HCL 20 MG/ML IJ SOLN
10.0000 mg | INTRAMUSCULAR | Status: DC
  Administered 2018-08-30 – 2018-09-03 (×22): 10 mg via INTRAVENOUS
  Filled 2018-08-30 (×22): qty 1

## 2018-08-30 MED ORDER — HYDROMORPHONE HCL 1 MG/ML IJ SOLN: 0.7500 mg | INTRAMUSCULAR | Status: DC | PRN

## 2018-08-30 MED ORDER — TERAZOSIN HCL 1 MG PO CAPS
2.0000 mg | ORAL_CAPSULE | Freq: Every day | ORAL | Status: DC
  Administered 2018-08-30 – 2018-09-02 (×4): 2 mg via ORAL
  Filled 2018-08-30 (×4): qty 2

## 2018-08-30 MED ORDER — MAGNESIUM SULFATE 2 GM/50ML IV SOLN: 2.0000 g | Freq: Once | INTRAVENOUS | Status: DC

## 2018-08-30 MED ORDER — HYDROMORPHONE HCL 1 MG/ML IJ SOLN
1.0000 mg | INTRAMUSCULAR | Status: DC | PRN
  Administered 2018-08-30: 1 mg via INTRAVENOUS
  Filled 2018-08-30: qty 1

## 2018-08-30 MED ORDER — HYDROMORPHONE HCL 1 MG/ML IJ SOLN
0.5000 mg | INTRAMUSCULAR | Status: DC | PRN
  Administered 2018-08-30 (×2): 0.5 mg via INTRAVENOUS
  Filled 2018-08-30 (×3): qty 0.5

## 2018-08-30 MED ORDER — METOPROLOL TARTRATE 5 MG/5ML IV SOLN
5.0000 mg | Freq: Once | INTRAVENOUS | Status: AC
  Administered 2018-08-30: 5 mg via INTRAVENOUS
  Filled 2018-08-30: qty 5

## 2018-08-30 MED ORDER — LABETALOL HCL 5 MG/ML IV SOLN
20.0000 mg | INTRAVENOUS | Status: DC | PRN
  Filled 2018-08-30: qty 4

## 2018-08-30 MED ORDER — SODIUM CHLORIDE 0.9 % IV SOLN: INTRAVENOUS | Status: DC

## 2018-08-30 NOTE — ED Notes (Signed)
Pt alert, yelling. RN to room to check on patient. Pt states she needs something for pain. Explained to patient what she had ordered. Pt refused. Pt wanting to speak to MD hospitalist. Notified.

## 2018-08-30 NOTE — Progress Notes (Addendum)
Pt continues to vomit and unable to do EKG. Reuel Boom, RN notified.

## 2018-08-30 NOTE — Progress Notes (Signed)
PROGRESS NOTE    Stephanie Drake  GEX:[id number]  DOB: 07/18/1999  DOA: 08/29/2018 PCP: Patient, No Pcp Per   Brief Admission Hx: 28 y.o. female with medical history significant of hypertension, seizure disorder, bipolar disorder, urolithiasis, chronic pancreatitis who is coming to the emergency department with complaints of fever with vomiting for the past 4 days.   MDM/Assessment & Plan:   1. Mild acute pancreatitis - pt's lipase normalized today, pt still complains of severe nausea and emesis. Continue IV fluids, trial of diet started.  2. Flank pain - pt has nephrolithiasis seen on CT scan, urinalysis still pending.  3. Essential hypertension - IV hydralazine ordered.  4. Hypokalemia - repleted in IV fluids. Follow bmp and magnesium.   DVT prophylaxis: heparin Code Status: full  Family Communication: stepmother at bedside Disposition Plan: home when medically stable  Subjective: Pt complaining of left flank pain and nausea reports that she vomited twice this morning.   Objective: Vitals:   08/30/18 0100 08/30/18 0602 08/30/18 1033 08/30/18 1352  BP: (!) 156/137 (!) 138/110 (!) 162/102 (!) 184/128  Pulse: 66 80 83 90  Resp: 18 18 16 20   Temp: 99 F (37.2 C) 98.2 F (36.8 C) 98.4 F (36.9 C) 98.9 F (37.2 C)  TempSrc: Oral Oral Oral Oral  SpO2: 100% 100% 100% 100%  Weight: 59.1 kg     Height: 5\' 6"  (1.676 m)       Intake/Output Summary (Last 24 hours) at 08/30/2018 1429 Last data filed at 08/30/2018 1000 Gross per 24 hour  Intake 198.82 ml  Output 300 ml  Net -101.18 ml   Filed Weights   08/29/18 1620 08/30/18 0100  Weight: 63.5 kg 59.1 kg     REVIEW OF SYSTEMS  As per history otherwise all reviewed and reported negative  Exam:  General exam: awake, mod distress cooperative Respiratory system: Clear. No increased work of breathing. Cardiovascular system: S1 & S2 heard. No JVD, murmurs, gallops, clicks or pedal edema. Gastrointestinal system: Abdomen is  nondistended, soft and LLQ tenderness and left CVA tenderness. Normal bowel sounds heard. Central nervous system: Alert and oriented. No focal neurological deficits. Extremities: no CCE.  Data Reviewed: Basic Metabolic Panel: Recent Labs  Lab 08/29/18 1817 08/30/18 0655  NA 136 136  K 3.2* 3.6  CL 100 105  CO2 21* 20*  GLUCOSE 78 73  BUN 12 12  CREATININE 1.16* 1.12*  CALCIUM 10.0 9.1  MG 2.4  --   PHOS 3.7  --    Liver Function Tests: Recent Labs  Lab 08/29/18 1817 08/30/18 0655  AST 39 26  ALT 30 23  ALKPHOS 71 53  BILITOT 0.7 0.5  PROT 9.3* 7.2  ALBUMIN 5.2* 3.9   Recent Labs  Lab 08/29/18 1817 08/30/18 0655  LIPASE 85* 48   No results for input(s): AMMONIA in the last 168 hours. CBC: Recent Labs  Lab 08/29/18 1817 08/30/18 0856  WBC 10.7* 10.1  NEUTROABS 7.8* 6.7  HGB 13.5 12.9  HCT 44.5 42.8  MCV 74.8* 78.0*  PLT 316 242   Cardiac Enzymes: No results for input(s): CKTOTAL, CKMB, CKMBINDEX, TROPONINI in the last 168 hours. CBG (last 3)  No results for input(s): GLUCAP in the last 72 hours. No results found for this or any previous visit (from the past 240 hour(s)).   Studies: Ct Head Wo Contrast  Result Date: 08/29/2018 CLINICAL DATA:  Headache, dizziness and lethargy for 4 days. Fever and vomiting. History of kidney infection. EXAM:  CT HEAD WITHOUT CONTRAST TECHNIQUE: Contiguous axial images were obtained from the base of the skull through the vertex without intravenous contrast. COMPARISON:  None. FINDINGS: BRAIN: No intraparenchymal hemorrhage, mass effect nor midline shift. The ventricles and sulci are normal. No acute large vascular territory infarcts. No abnormal extra-axial fluid collections. Basal cisterns are patent. VASCULAR: Unremarkable. SKULL/SOFT TISSUES: No skull fracture. No significant soft tissue swelling. ORBITS/SINUSES: The included ocular globes and orbital contents are normal.Trace paranasal sinus mucosal thickening. Mastoid air  cells are well aerated. OTHER: None. IMPRESSION: Normal CT HEAD without contrast. Electronically Signed   By: Awilda Metro M.D.   On: 08/29/2018 20:34   Dg Abdomen Acute W/chest  Result Date: 08/29/2018 CLINICAL DATA:  Fever and vomiting for the past 4 days. EXAM: DG ABDOMEN ACUTE W/ 1V CHEST COMPARISON:  None. FINDINGS: There is no evidence of dilated bowel loops or free intraperitoneal air. 3 mm calcification in the lower pole of the left kidney is noted. Heart size and mediastinal contours are within normal limits. Both lungs are clear. IMPRESSION: Unremarkable bowel gas pattern. A 3 mm calcification projects over the lower pole the left kidney suspicious for left renal calculus. No acute cardiopulmonary disease. Electronically Signed   By: Tollie Eth M.D.   On: 08/29/2018 21:06     Scheduled Meds: . heparin  5,000 Units Subcutaneous Q8H   Continuous Infusions: . 0.9 % NaCl with KCl 20 mEq / L 75 mL/hr at 08/30/18 1323  . famotidine (PEPCID) IV 20 mg (08/30/18 7076)    Principal Problem:   Acute pancreatitis Active Problems:   Hypertension   Hypokalemia   Time spent:   Standley Dakins, MD [facility] 08/30/2018, 2:29 PM    LOS: 0 days

## 2018-08-31 ENCOUNTER — Encounter (HOSPITAL_COMMUNITY): Payer: Self-pay | Admitting: Gastroenterology

## 2018-08-31 ENCOUNTER — Observation Stay (HOSPITAL_COMMUNITY): Payer: Medicaid - Out of State

## 2018-08-31 DIAGNOSIS — K859 Acute pancreatitis without necrosis or infection, unspecified: Secondary | ICD-10-CM | POA: Diagnosis not present

## 2018-08-31 DIAGNOSIS — E876 Hypokalemia: Secondary | ICD-10-CM | POA: Diagnosis not present

## 2018-08-31 DIAGNOSIS — I10 Essential (primary) hypertension: Secondary | ICD-10-CM | POA: Diagnosis not present

## 2018-08-31 LAB — CBC WITH DIFFERENTIAL/PLATELET
ABS IMMATURE GRANULOCYTES: 0.04 10*3/uL (ref 0.00–0.07)
BASOS ABS: 0.1 10*3/uL (ref 0.0–0.1)
Basophils Relative: 1 %
Eosinophils Absolute: 0 10*3/uL (ref 0.0–0.5)
Eosinophils Relative: 0 %
HCT: 37.6 % (ref 36.0–46.0)
Hemoglobin: 11.4 g/dL — ABNORMAL LOW (ref 12.0–15.0)
IMMATURE GRANULOCYTES: 1 %
Lymphocytes Relative: 19 %
Lymphs Abs: 1.6 10*3/uL (ref 0.7–4.0)
MCH: 23.4 pg — ABNORMAL LOW (ref 26.0–34.0)
MCHC: 30.3 g/dL (ref 30.0–36.0)
MCV: 77.2 fL — ABNORMAL LOW (ref 80.0–100.0)
Monocytes Absolute: 1 10*3/uL (ref 0.1–1.0)
Monocytes Relative: 11 %
NEUTROS ABS: 6.1 10*3/uL (ref 1.7–7.7)
NRBC: 0 % (ref 0.0–0.2)
Neutrophils Relative %: 68 %
Platelets: 240 10*3/uL (ref 150–400)
RBC: 4.87 MIL/uL (ref 3.87–5.11)
RDW: 14.6 % (ref 11.5–15.5)
WBC: 8.7 10*3/uL (ref 4.0–10.5)

## 2018-08-31 LAB — COMPREHENSIVE METABOLIC PANEL
ALT: 22 U/L (ref 0–44)
AST: 20 U/L (ref 15–41)
Albumin: 4.1 g/dL (ref 3.5–5.0)
Alkaline Phosphatase: 52 U/L (ref 38–126)
Anion gap: 13 (ref 5–15)
BUN: 8 mg/dL (ref 6–20)
CO2: 16 mmol/L — AB (ref 22–32)
Calcium: 9.1 mg/dL (ref 8.9–10.3)
Chloride: 107 mmol/L (ref 98–111)
Creatinine, Ser: 1.03 mg/dL — ABNORMAL HIGH (ref 0.44–1.00)
GFR calc Af Amer: >60 mL/min (ref >60)
GFR calc non Af Amer: >60 mL/min (ref >60)
Glucose, Bld: 98 mg/dL (ref 70–99)
Potassium: 3.4 mmol/L — ABNORMAL LOW (ref 3.5–5.1)
Sodium: 136 mmol/L (ref 135–145)
Total Bilirubin: 0.8 mg/dL (ref 0.3–1.2)
Total Protein: 7 g/dL (ref 6.5–8.1)

## 2018-08-31 LAB — LIPID PANEL
Cholesterol: 188 mg/dL (ref 0–200)
HDL: 51 mg/dL (ref >40)
LDL Cholesterol: 127 mg/dL — ABNORMAL HIGH (ref 0–99)
TRIGLYCERIDES: 52 mg/dL (ref <150)
Total CHOL/HDL Ratio: 3.7 RATIO
VLDL: 10 mg/dL (ref 0–40)

## 2018-08-31 LAB — TSH: TSH: 0.083 u[IU]/mL — ABNORMAL LOW (ref 0.350–4.500)

## 2018-08-31 LAB — T4, FREE: Free T4: 1.53 ng/dL (ref 0.82–1.77)

## 2018-08-31 LAB — LIPASE, BLOOD: Lipase: 64 U/L — ABNORMAL HIGH (ref 11–51)

## 2018-08-31 MED ORDER — METOPROLOL TARTRATE 5 MG/5ML IV SOLN: 5.0000 mg | Freq: Four times a day (QID) | INTRAVENOUS | Status: DC

## 2018-08-31 MED ORDER — ENALAPRILAT 1.25 MG/ML IV SOLN
0.6250 mg | Freq: Four times a day (QID) | INTRAVENOUS | Status: DC
  Administered 2018-08-31 (×2): 0.625 mg via INTRAVENOUS
  Filled 2018-08-31 (×2): qty 2

## 2018-08-31 MED ORDER — POTASSIUM CHLORIDE 10 MEQ/100ML IV SOLN
10.0000 meq | INTRAVENOUS | Status: AC
  Administered 2018-08-31 (×3): 10 meq via INTRAVENOUS
  Filled 2018-08-31 (×3): qty 100

## 2018-08-31 MED ORDER — ONDANSETRON HCL 4 MG/2ML IJ SOLN
4.0000 mg | Freq: Four times a day (QID) | INTRAMUSCULAR | Status: DC
  Administered 2018-08-31 – 2018-09-03 (×11): 4 mg via INTRAVENOUS
  Filled 2018-08-31 (×11): qty 2

## 2018-08-31 NOTE — Progress Notes (Signed)
PROGRESS NOTE  Janeice Robinson  TSV:[id number]  DOB: 07/18/1999  DOA: 08/29/2018 PCP: Patient, No Pcp Per   Brief Admission Hx: 28 y.o. female with medical history significant of hypertension, seizure disorder, bipolar disorder, polysubstance abuse, urolithiasis, chronic pancreatitis who presented to the emergency department with complaints of fever with vomiting for the past 4 days prior to admission.   MDM/Assessment & Plan:   1. Mild acute pancreatitis - pt continues to complains of severe nausea and emesis and abdominal pain, her abdominal exam is benign today. Continue IV fluids, antiemetics, liquid diet.  Lipid panel pending.  2. Intractable nausea and vomiting - continue supportive care, continue IV protonix, requesting GI consult.  Pregnancy test negative.  TSH pending.  Possible withdrawal from multiple recreational drugs.   3. Flank pain - pt has nephrolithiasis seen on CT scan, IV pain meds ordered as needed. 4. Polysubstance abuse - pt tested positive for cocaine, opioids and THC.   5. Essential hypertension - IV hydralazine and vasotec ordered.  Hold beta blockers due to cocaine positive.  6. Hypokalemia - replacing in IV fluids. Follow bmp and magnesium.  7. Ectopic atrial arrhythmia - avoiding beta blockers with cocaine use.    DVT prophylaxis: heparin Code Status: full  Family Communication: stepmother at bedside Disposition Plan: home when medically stable  Subjective: Pt complaining of persistent vomiting, nausea and abdominal pain.    Objective: Vitals:   08/30/18 2103 08/30/18 2229 08/31/18 0140 08/31/18 0531  BP: (!) 178/122 116/80 133/82 (!) 172/119  Pulse: 86 (!) 110 82 (!) 123  Resp: 20   20  Temp: 99 F (37.2 C)   98.4 F (36.9 C)  TempSrc: Oral   Oral  SpO2: 100% 100%  99%  Weight:      Height:        Intake/Output Summary (Last 24 hours) at 08/31/2018 0851 Last data filed at 08/30/2018 1700 Gross per 24 hour  Intake 720 ml  Output 300 ml  Net 420  ml   Filed Weights   08/29/18 1620 08/30/18 0100  Weight: 63.5 kg 59.1 kg    REVIEW OF SYSTEMS  As per history otherwise all reviewed and reported negative  Exam:  General exam: awake, mod distress cooperative Respiratory system: Clear. No increased work of breathing. Cardiovascular system: S1 & S2 heard. No JVD, murmurs, gallops, clicks or pedal edema. Gastrointestinal system: Abdomen is nondistended, soft and LUQ tenderness, no guarding, and left CVA tenderness. Normal bowel sounds heard. Central nervous system: Alert and oriented. No focal neurological deficits. Extremities: no CCE.  Data Reviewed: Basic Metabolic Panel: Recent Labs  Lab 08/29/18 1817 08/30/18 0655 08/31/18 0503  NA 136 136 136  K 3.2* 3.6 3.4*  CL 100 105 107  CO2 21* 20* 16*  GLUCOSE 78 73 98  BUN 12 12 8   CREATININE 1.16* 1.12* 1.03*  CALCIUM 10.0 9.1 9.1  MG 2.4  --   --   PHOS 3.7  --   --    Liver Function Tests: Recent Labs  Lab 08/29/18 1817 08/30/18 0655 08/31/18 0503  AST 39 26 20  ALT 30 23 22   ALKPHOS 71 53 52  BILITOT 0.7 0.5 0.8  PROT 9.3* 7.2 7.0  ALBUMIN 5.2* 3.9 4.1   Recent Labs  Lab 08/29/18 1817 08/30/18 0655 08/31/18 0503  LIPASE 85* 48 64*   No results for input(s): AMMONIA in the last 168 hours. CBC: Recent Labs  Lab 08/29/18 1817 08/30/18 0856 08/31/18 0503  WBC  10.7* 10.1 8.7  NEUTROABS 7.8* 6.7 6.1  HGB 13.5 12.9 11.4*  HCT 44.5 42.8 37.6  MCV 74.8* 78.0* 77.2*  PLT 316 242 240   Cardiac Enzymes: No results for input(s): CKTOTAL, CKMB, CKMBINDEX, TROPONINI in the last 168 hours. CBG (last 3)  No results for input(s): GLUCAP in the last 72 hours. No results found for this or any previous visit (from the past 240 hour(s)).   Studies: Ct Head Wo Contrast  Result Date: 08/29/2018 CLINICAL DATA:  Headache, dizziness and lethargy for 4 days. Fever and vomiting. History of kidney infection. EXAM: CT HEAD WITHOUT CONTRAST TECHNIQUE: Contiguous axial  images were obtained from the base of the skull through the vertex without intravenous contrast. COMPARISON:  None. FINDINGS: BRAIN: No intraparenchymal hemorrhage, mass effect nor midline shift. The ventricles and sulci are normal. No acute large vascular territory infarcts. No abnormal extra-axial fluid collections. Basal cisterns are patent. VASCULAR: Unremarkable. SKULL/SOFT TISSUES: No skull fracture. No significant soft tissue swelling. ORBITS/SINUSES: The included ocular globes and orbital contents are normal.Trace paranasal sinus mucosal thickening. Mastoid air cells are well aerated. OTHER: None. IMPRESSION: Normal CT HEAD without contrast. Electronically Signed   By: Awilda Metroourtnay  Bloomer M.D.   On: 08/29/2018 20:34   Dg Abdomen Acute W/chest  Result Date: 08/29/2018 CLINICAL DATA:  Fever and vomiting for the past 4 days. EXAM: DG ABDOMEN ACUTE W/ 1V CHEST COMPARISON:  None. FINDINGS: There is no evidence of dilated bowel loops or free intraperitoneal air. 3 mm calcification in the lower pole of the left kidney is noted. Heart size and mediastinal contours are within normal limits. Both lungs are clear. IMPRESSION: Unremarkable bowel gas pattern. A 3 mm calcification projects over the lower pole the left kidney suspicious for left renal calculus. No acute cardiopulmonary disease. Electronically Signed   By: Tollie Ethavid  Kwon M.D.   On: 08/29/2018 21:06   Scheduled Meds: . heparin  5,000 Units Subcutaneous Q8H  . hydrALAZINE  10 mg Intravenous Q4H  . pantoprazole (PROTONIX) IV  40 mg Intravenous Q24H  . terazosin  2 mg Oral QHS   Continuous Infusions: . 0.9 % NaCl with KCl 20 mEq / L 150 mL/hr at 08/31/18 0352  . potassium chloride 10 mEq (08/31/18 [id number])    Principal Problem:   Acute pancreatitis Active Problems:   Hypertension   Hypokalemia  Time spent:   Standley Dakinslanford , MD [facility] 08/31/2018, 8:51 AM    LOS: 0 days

## 2018-08-31 NOTE — Consult Note (Addendum)
Referring Provider: No ref. provider found Primary Care Physician:  Patient, No Pcp Per Primary Gastroenterologist:  Jonette Eva  Reason for Consultation:  ABDOMINAL PAIN. NAUSEA, VOMITING   Impression: ADMITTED WITH NAUSEA, VOMITING, AND ABDOMINAL PAIN ONSET 5 DAYS AGO AND PT IS NOT CLINICALLY IMPROVED. DIFFERENTIAL DIAGNOSIS INCLUDES: ACUTE IDIOPATHIC PANCREATITIS, CANNABIS HYPEREMESIS SYNDROME,  VIRAL ILLNESS, LESS LIKELY UNCONTROLLED GERD, PUD, H PYLORI GASTRITS, OR MESENTERIC ISCHEMIA.  Plan: 1. SUPPORTIVE CARE: AGGRESSIVE HYDRATION FOR 24 HRS. 2. PROTONIX 40 MG IV Q12H 3. ZOFRAN 4 MG IV Q6H AND PRN. 4. PHENERGAN PRN. 5. NEED EGD/DIL(Dx: DYSPHAGIA/?HEMATEMEISIS) IN 4 WEEKS AS AN OUTPT. PT CURRENTLY NOT A CANDIDATE FOR SEDATION DUE TO UDS POSITIVE FOR COCAINE. 5. AWAIT RESULTS OF ultrasound. 6. CONSIDER REPEAT CT SCAN PANCREATIC PROTOCOL IF PT DOES NOT IMPROVE OVER THE NEXT 48 HRS. 7. NEEDS TO SEE A PANCREAS SPECIALIST AND HAVE GENETIC TESTING FOR HEREDITARY PANCREATITIS.     HPI:  Bowel problems since age 48: Chronic Pancreatitis?. WORKED UP IN [place]. NEVER SAW A PANCREAS SPECIALIST.  DID WELL AFTER FIRST ATTACK AND THEN NO REAL PROBLEMS UNTIL A YEAR. GB TAKEN(AGE 73) OUT DUE TO PANCREATITIS. NO GENETIC TESTING. NO ETOH. NO NEW MED OR OTC SUPPLEMENTS. SYMPTOMS STARTED 5 DAYS AGO. FELT CHILLY AND HAD TEMP 101.10F WED. ALWAYS COLD.  GETTING MEDS PRN. LAST SMOKED THC 1 WEEK AGO. OCCASIONAL DIARRHEA AND CONSTIPATION. FEEL LIKE FOOD STUCK GOING DOWN 1-2X/WEEK. HEARTBURN: 1-2X/WEEK. LMP: ON NOW. LUQ PAIN: DULL AND ACHY, TWISTING, RADIATES TO BACK. WHEN NOT HAVING A FLARE SHE'S PAIN FREE. SEEN IN ED IN [place] THUR JAN 30 AND THEN SENT HER HOME ON MEDS AND EXTREMELY SEDATED.  D/C ON BACTRIM AND ULTRAM BUT UNABLE TO KEEP MEDS DOWN DUE TO VOMITING(?BLOOD IN VOMIT x1). FEELS NAUSEATED RIGHT NOW.   PT DENIES FEVER, CHILLS, HEMATOCHEZIA, melena, CHEST PAIN, SHORTNESS OF BREATH, CHANGE IN  BOWEL IN HABITS, OR problems with sedation.   Past Medical History:  Diagnosis Date  . Hypertension   . Kidney infection   . Pancreatitis    Past Surgical History:  Procedure Laterality Date  . CHOLECYSTECTOMY      Prior to Admission medications   Medication Sig Start Date End Date Taking? Authorizing Provider  sulfamethoxazole-trimethoprim (BACTRIM DS,SEPTRA DS) 800-160 MG tablet Take 1 tablet by mouth every 12 (twelve) hours. 08/29/18  Yes  ONLY ONE DOSE  traMADol (ULTRAM) 50 MG tablet Take 1 tablet by mouth 2 (two) times daily. 08/29/18  Yes ONLY TOOK TWO DOSES    Current Facility-Administered Medications  Medication Dose Route Frequency Provider Last Rate Last Dose  . 0.9 % NaCl with KCl 20 mEq/ L  infusion   Intravenous Continuous Johnson, Clanford L, MD 150 mL/hr at 08/31/18 0352    . acetaminophen (TYLENOL) tablet 650 mg  650 mg Oral Q6H PRN Bobette Mo, MD       Or  . acetaminophen (TYLENOL) suppository 650 mg  650 mg Rectal Q6H PRN Bobette Mo, MD      . enalaprilat (VASOTEC) injection 0.625 mg  0.625 mg Intravenous Q6H Johnson, Clanford L, MD      . heparin injection 5,000 Units  5,000 Units Subcutaneous Q8H Bobette Mo, MD   5,000 Units at 08/31/18 0545  . hydrALAZINE (APRESOLINE) injection 10 mg  10 mg Intravenous Q4H Johnson, Clanford L, MD   10 mg at 08/31/18 0824  . HYDROmorphone (DILAUDID) injection 0.25 mg  0.25 mg Intravenous Q3H PRN Johnson, Clanford L,  MD   0.25 mg at 08/31/18 0942  . labetalol (NORMODYNE,TRANDATE) injection 20 mg  20 mg Intravenous Q2H PRN Bobette Mo, MD      . ondansetron [facility]) tablet 4 mg  4 mg Oral Q6H PRN Bobette Mo, MD       Or  . ondansetron [facility]) injection 4 mg  4 mg Intravenous Q6H PRN Bobette Mo, MD   4 mg at 08/31/18 0550  . pantoprazole (PROTONIX) injection 40 mg  40 mg Intravenous Q24H Johnson, Clanford L, MD   40 mg at 08/30/18 1617  . potassium chloride 10 mEq in 100 mL IVPB   10 mEq Intravenous Q1 Hr x 3 Johnson, Clanford L, MD 100 mL/hr at 08/31/18 0940 10 mEq at 08/31/18 0940  . promethazine (PHENERGAN) injection 12.5 mg  12.5 mg Intravenous Q6H PRN Johnson, Clanford L, MD   12.5 mg at 08/31/18 0940  . terazosin (HYTRIN) capsule 2 mg  2 mg Oral QHS Bobette Mo, MD   2 mg at 08/30/18 2130   Family History  Problem Relation Age of Onset  . Pancreatitis Maternal Grandmother        ETIOLOGY UNKNOWN  . Mesothelioma Maternal Aunt   . Colon cancer Neg Hx   . Colon polyps Neg Hx   . Cystic fibrosis Neg Hx     Social History   Socioeconomic History  . Marital status: Single    Spouse name: Not on file  . Number of children: Not on file  . Years of education: Not on file  . Highest education level: Not on file  Occupational History  . Not on file  Social Needs  . Financial resource strain: Not on file  . Food insecurity:    Worry: Not on file    Inability: Not on file  . Transportation needs:    Medical: Not on file    Non-medical: Not on file  Tobacco Use  . Smoking status: Current Every Day Smoker    Packs/day: 0.50  . Smokeless tobacco: Never Used  Substance and Sexual Activity  . Alcohol use: Never    Frequency: Never  . Drug use: Yes    Types: Marijuana  . Sexual activity: Not on file  Lifestyle  . Physical activity:    Days per week: Not on file    Minutes per session: Not on file  . Stress: Not on file  Relationships  . Social connections:    Talks on phone: Not on file    Gets together: Not on file    Attends religious service: Not on file    Active member of club or organization: Not on file    Attends meetings of clubs or organizations: Not on file    Relationship status: Not on file  Other Topics Concern  . Not on file  Social History Narrative   Unable to work right now. Here visiting family IN [place] AND PROBABLY GOING BACK TO [place] MAYBE. MOM AT BEDSIDE AND FAMILY IS GOING THROUGH A DIVORCE. 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   Review of Systems: PER HPI OTHERWISE ALL SYSTEMS ARE NEGATIVE.   Vitals: Blood pressure (!) 172/119, pulse (!) 123, temperature 98.4 F (36.9 C), temperature source Oral, resp. rate 20, height 5\' 6"  (1.676 m), weight 59.1 kg, last menstrual period 08/10/2018, SpO2 99 %.  Physical Exam: General:   Alert,  Well-developed, well-nourished, pleasant and cooperative in NAD Head:  Normocephalic and atraumatic.  Eyes:  Sclera clear, no icterus.   Conjunctiva pink. Mouth:  No lesions, dentition normal. Neck:  Supple; no masses. Lungs:  Clear throughout to auscultation.   No wheezes. No acute distress. Heart:  Regular rate and rhythm; no murmurs, clicks, rubs,  or gallops. Abdomen:  Soft, nontender and nondistended. No masses, hepatosplenomegaly or hernias noted. Normal bowel sounds, without guarding, and without rebound.   Msk:  Symmetrical without gross deformities. Normal posture. Extremities:  Without edema. Neurologic:  Alert and  oriented x4;  grossly normal neurologically. Cervical Nodes:  No significant cervical adenopathy. Psych:  Alert and cooperative. Normal mood and affect.   Lab Results: Recent Labs    08/29/18 1817 08/30/18 0856 08/31/18 0503  WBC 10.7* 10.1 8.7  HGB 13.5 12.9 11.4*  HCT 44.5 42.8 37.6  PLT 316 242 240   BMET Recent Labs    08/30/18 0655 08/31/18 0503  NA 136 136  K 3.6 3.4*  CL 105 107  CO2 20* 16*  GLUCOSE 73 98  BUN 12 8  CREATININE 1.12* 1.03*  CALCIUM 9.1 9.1   LFT Recent Labs    08/31/18 0503  PROT 7.0  ALBUMIN 4.1  AST 20  ALT 22  ALKPHOS 52  BILITOT 0.8    Studies/Results: JAN 31: NAICP,  US: FEB 2 PENDING, CT AND/PELVIS W/O CONTRAST AT [place] [facility]-NL PANCREAS, LIVER, BOWELS, & SPLEEN, NON-OBSTRUCTING LEFT KIDNEY STONE OTHERWISE NO ACUTE INTRAABDOMINAL PROCESS    LOS: 0 days   Dellia Donnelly  08/31/2018, 10:02 AM

## 2018-09-01 ENCOUNTER — Inpatient Hospital Stay (HOSPITAL_COMMUNITY): Payer: Medicaid - Out of State

## 2018-09-01 ENCOUNTER — Observation Stay (HOSPITAL_COMMUNITY): Payer: Medicaid - Out of State

## 2018-09-01 DIAGNOSIS — K859 Acute pancreatitis without necrosis or infection, unspecified: Secondary | ICD-10-CM | POA: Diagnosis present

## 2018-09-01 DIAGNOSIS — Z87442 Personal history of urinary calculi: Secondary | ICD-10-CM | POA: Diagnosis not present

## 2018-09-01 DIAGNOSIS — R112 Nausea with vomiting, unspecified: Secondary | ICD-10-CM

## 2018-09-01 DIAGNOSIS — R1012 Left upper quadrant pain: Secondary | ICD-10-CM

## 2018-09-01 DIAGNOSIS — Z79899 Other long term (current) drug therapy: Secondary | ICD-10-CM | POA: Diagnosis not present

## 2018-09-01 DIAGNOSIS — Z79891 Long term (current) use of opiate analgesic: Secondary | ICD-10-CM | POA: Diagnosis not present

## 2018-09-01 DIAGNOSIS — N200 Calculus of kidney: Secondary | ICD-10-CM | POA: Diagnosis present

## 2018-09-01 DIAGNOSIS — K8681 Exocrine pancreatic insufficiency: Secondary | ICD-10-CM | POA: Diagnosis present

## 2018-09-01 DIAGNOSIS — F1910 Other psychoactive substance abuse, uncomplicated: Secondary | ICD-10-CM | POA: Diagnosis present

## 2018-09-01 DIAGNOSIS — K861 Other chronic pancreatitis: Secondary | ICD-10-CM | POA: Diagnosis present

## 2018-09-01 DIAGNOSIS — I10 Essential (primary) hypertension: Secondary | ICD-10-CM | POA: Diagnosis present

## 2018-09-01 DIAGNOSIS — E876 Hypokalemia: Secondary | ICD-10-CM | POA: Diagnosis present

## 2018-09-01 DIAGNOSIS — Z881 Allergy status to other antibiotic agents status: Secondary | ICD-10-CM | POA: Diagnosis not present

## 2018-09-01 DIAGNOSIS — G40909 Epilepsy, unspecified, not intractable, without status epilepticus: Secondary | ICD-10-CM | POA: Diagnosis present

## 2018-09-01 DIAGNOSIS — F1290 Cannabis use, unspecified, uncomplicated: Secondary | ICD-10-CM | POA: Diagnosis present

## 2018-09-01 DIAGNOSIS — Z9049 Acquired absence of other specified parts of digestive tract: Secondary | ICD-10-CM | POA: Diagnosis not present

## 2018-09-01 DIAGNOSIS — R4182 Altered mental status, unspecified: Secondary | ICD-10-CM | POA: Diagnosis present

## 2018-09-01 DIAGNOSIS — F17210 Nicotine dependence, cigarettes, uncomplicated: Secondary | ICD-10-CM | POA: Diagnosis present

## 2018-09-01 DIAGNOSIS — K920 Hematemesis: Secondary | ICD-10-CM | POA: Diagnosis present

## 2018-09-01 DIAGNOSIS — F319 Bipolar disorder, unspecified: Secondary | ICD-10-CM | POA: Diagnosis present

## 2018-09-01 LAB — COMPREHENSIVE METABOLIC PANEL
ALT: 16 U/L (ref 0–44)
AST: 14 U/L — ABNORMAL LOW (ref 15–41)
Albumin: 3.2 g/dL — ABNORMAL LOW (ref 3.5–5.0)
Alkaline Phosphatase: 42 U/L (ref 38–126)
Anion gap: 5 (ref 5–15)
BUN: 8 mg/dL (ref 6–20)
CO2: 19 mmol/L — ABNORMAL LOW (ref 22–32)
Calcium: 8.6 mg/dL — ABNORMAL LOW (ref 8.9–10.3)
Chloride: 115 mmol/L — ABNORMAL HIGH (ref 98–111)
Creatinine, Ser: 1.18 mg/dL — ABNORMAL HIGH (ref 0.44–1.00)
GFR calc Af Amer: >60 mL/min (ref >60)
Glucose, Bld: 104 mg/dL — ABNORMAL HIGH (ref 70–99)
Potassium: 4 mmol/L (ref 3.5–5.1)
Sodium: 139 mmol/L (ref 135–145)
Total Bilirubin: 0.3 mg/dL (ref 0.3–1.2)
Total Protein: 5.6 g/dL — ABNORMAL LOW (ref 6.5–8.1)

## 2018-09-01 LAB — CBC WITH DIFFERENTIAL/PLATELET
Abs Immature Granulocytes: 0.03 10*3/uL (ref 0.00–0.07)
Basophils Absolute: 0 10*3/uL (ref 0.0–0.1)
Basophils Relative: 1 %
EOS PCT: 1 %
Eosinophils Absolute: 0.1 10*3/uL (ref 0.0–0.5)
HCT: 33.5 % — ABNORMAL LOW (ref 36.0–46.0)
Hemoglobin: 10 g/dL — ABNORMAL LOW (ref 12.0–15.0)
Immature Granulocytes: 0 %
Lymphocytes Relative: 33 %
Lymphs Abs: 2.3 10*3/uL (ref 0.7–4.0)
MCH: 23.1 pg — AB (ref 26.0–34.0)
MCHC: 29.9 g/dL — ABNORMAL LOW (ref 30.0–36.0)
MCV: 77.5 fL — ABNORMAL LOW (ref 80.0–100.0)
MONO ABS: 0.9 10*3/uL (ref 0.1–1.0)
Monocytes Relative: 14 %
Neutro Abs: 3.6 10*3/uL (ref 1.7–7.7)
Neutrophils Relative %: 51 %
Platelets: 206 10*3/uL (ref 150–400)
RBC: 4.32 MIL/uL (ref 3.87–5.11)
RDW: 15.4 % (ref 11.5–15.5)
WBC: 6.9 10*3/uL (ref 4.0–10.5)
nRBC: 0 % (ref 0.0–0.2)

## 2018-09-01 LAB — LIPASE, BLOOD: Lipase: 90 U/L — ABNORMAL HIGH (ref 11–51)

## 2018-09-01 MED ORDER — IOHEXOL 300 MG/ML  SOLN
100.0000 mL | Freq: Once | INTRAMUSCULAR | Status: AC | PRN
  Administered 2018-09-01: 100 mL via INTRAVENOUS

## 2018-09-01 MED ORDER — HYDROMORPHONE HCL 1 MG/ML IJ SOLN
0.2500 mg | Freq: Once | INTRAMUSCULAR | Status: AC
  Administered 2018-09-01: 0.25 mg via INTRAVENOUS
  Filled 2018-09-01: qty 0.5

## 2018-09-01 MED ORDER — PROMETHAZINE HCL 25 MG/ML IJ SOLN
12.5000 mg | Freq: Three times a day (TID) | INTRAMUSCULAR | Status: DC
  Administered 2018-09-01 – 2018-09-02 (×3): 12.5 mg via INTRAVENOUS
  Filled 2018-09-01 (×3): qty 1

## 2018-09-01 MED ORDER — IOPAMIDOL (ISOVUE-300) INJECTION 61%: 100.0000 mL | Freq: Once | INTRAVENOUS | Status: DC | PRN

## 2018-09-01 MED ORDER — PANTOPRAZOLE SODIUM 40 MG IV SOLR
40.0000 mg | Freq: Two times a day (BID) | INTRAVENOUS | Status: DC
  Administered 2018-09-01 – 2018-09-02 (×3): 40 mg via INTRAVENOUS
  Filled 2018-09-01 (×3): qty 40

## 2018-09-01 NOTE — Progress Notes (Addendum)
Received call from Chales Abrahams, RN taking care of patient. Patient has refused CT scan.   Gelene Mink, PhD, ANP-BC [facility]    Addendum: received call from Chales Abrahams at 1210 that patient has agreed to CT and completing now.  Gelene Mink, PhD, ANP-BC [facility]

## 2018-09-01 NOTE — Progress Notes (Addendum)
Subjective: In tears, just had episode of vomiting, no hematemesis. Persistent pain LUQ radiating around to back. Worsened after ice chips. Pain not controlled.   Objective: Vital signs in last 24 hours: Temp:  [98.3 F (36.8 C)-98.7 F (37.1 C)] 98.6 F (37 C) (02/03 16100608) Pulse Rate:  [98-128] 98 (02/03 0608) Resp:  [16-18] 16 (02/03 0608) BP: (91-125)/(47-82) 125/82 (02/03 0705) SpO2:  [98 %-100 %] 100 % (02/03 96040608) Last BM Date: 08/27/18 General:   Alert and oriented, noticeable uncomfortable, in tears Abdomen:  Bowel sounds present but hypoactive, soft, TTP LUQ, no rebound or guarding Neurologic:  Alert and  oriented x4 Psych: flat affect and tearful   Intake/Output from previous day: 02/02 0701 - 02/03 0700 In: 6105.4 [P.O.:480; I.V.:5349.5; IV Piggyback:275.9] Out: -  Intake/Output this shift: No intake/output data recorded.  Lab Results: Recent Labs    08/30/18 0856 08/31/18 0503 09/01/18 0634  WBC 10.1 8.7 6.9  HGB 12.9 11.4* 10.0*  HCT 42.8 37.6 33.5*  PLT 242 240 206   BMET Recent Labs    08/29/18 1817 08/30/18 0655 08/31/18 0503  NA 136 136 136  K 3.2* 3.6 3.4*  CL 100 105 107  CO2 21* 20* 16*  GLUCOSE 78 73 98  BUN 12 12 8   CREATININE 1.16* 1.12* 1.03*  CALCIUM 10.0 9.1 9.1   LFT Recent Labs    08/29/18 1817 08/30/18 0655 08/31/18 0503  PROT 9.3* 7.2 7.0  ALBUMIN 5.2* 3.9 4.1  AST 39 26 20  ALT 30 23 22   ALKPHOS 71 53 52  BILITOT 0.7 0.5 0.8   Lab Results  Component Value Date   LIPASE 90 (H) 09/01/2018     Studies/Results: [place] Abdomen Complete  Result Date: 08/31/2018 CLINICAL DATA:  Nausea/fevers x3 days EXAM: ABDOMEN ULTRASOUND COMPLETE COMPARISON:  None. FINDINGS: Gallbladder: Surgically absent. Common bile duct: Diameter: 4 mm Liver: No focal lesion identified. Within normal limits in parenchymal echogenicity. Portal vein is patent on color Doppler imaging with normal direction of blood flow towards the liver. IVC: No  abnormality visualized. Pancreas: Visualized portion unremarkable. Spleen: Size and appearance within normal limits. Right Kidney: Length: 8.9 cm. Heterogeneous parenchymal echogenicity. No mass or hydronephrosis. Left Kidney: Length: 9.9 cm. Heterogeneous parenchymal echogenicity. No mass or hydronephrosis. Abdominal aorta: No aneurysm visualized. Other findings: None. IMPRESSION: Heterogeneous parenchymal echogenicity of the bilateral kidneys, nonspecific. This appearance may reflect sequela of prior infection/inflammation. No convincing perirenal edema or perinephric fluid to suggest acute inflammation such as pyelonephritis, although ultrasound is nonspecific in this evaluation, and that remains a clinical diagnosis. Status post cholecystectomy. Electronically Signed   By: Charline BillsSriyesh  Krishnan M.D.   On: 08/31/2018 10:25    Assessment: 28 year old female with reported history of pancreatitis diagnosed approximately age 28, presenting with acute abdominal pain, nausea, and vomiting. Symptoms persistent and not improved with supportive measures. US abdomen complete without concerning findings.   As nausea is persistent, continue Zofran scheduled and change Phenergan to scheduled for next 24 hours. As per plan, will pursue CT with pancreatic protocol today. Will need outpatient evaluation at tertiary facility for further evaluation of pancreatitis.   Hematemesis: no further evidence. Will need EGD as outpatient. Consider EGD as inpatient if CT unrevealing and persistent pain.    Plan: Dilaudid 0.25 mg IV X one now Change Phenergan to 12.5 mg IV scheduled Continue Zofran scheduled Increase PPI to BID Strict NPO: discussed with patient no ice chips CT with pancreatic protocol today Follow-up on  pending CMP Will need tertiary referral as outpatient EGD as outpatient pending clinical course    Gelene MinkAnna W. Jamieka Royle, PhD, ANP-BC [facility]      LOS: 0 days    09/01/2018, 8:02 AM

## 2018-09-01 NOTE — Progress Notes (Signed)
PROGRESS NOTE  Stephanie Drake  UJW:[id number]  DOB: 07/18/1999  DOA: 08/29/2018 PCP: Patient, No Pcp Per   Brief Admission Hx: 28 y.o. female with medical history significant of hypertension, seizure disorder, bipolar disorder, polysubstance abuse, urolithiasis, chronic pancreatitis who presented to the emergency department with complaints of fever with vomiting for the past 4 days prior to admission.   MDM/Assessment & Plan:   1. Mild acute pancreatitis - pt continues to complains of severe nausea and emesis and abdominal pain, her abdominal exam is benign. Her lipase level is going up despite the IV fluids and bowel rest.   Continue IV fluids, antiemetics, liquid diet.  Lipid panel reviewed. CT pancreas protocol ordered by GI team.  I really do appreciate assistance of GI team.   2. Intractable nausea and vomiting - persistent, continue supportive care, continue IV protonix, requesting GI consult.  Pregnancy test negative.  TSH low but free T4 within normal limits.  Likely a mild subclinical hypothyroidism.   Considering withdrawal from multiple recreational drugs as part of DDx.   3. Flank pain - pt has nephrolithiasis seen on CT scan, IV pain meds ordered as needed.  Renal US unremarkable.  4. Polysubstance abuse - pt tested positive for cocaine, opioids and THC.  Social worker consult for treatment options.  5. Essential hypertension - IV hydralazine and vasotec ordered.  Hold beta blockers due to cocaine positive.  BP better controlled. DC vasotec 2/3.  6. Hypokalemia - repleted.  Follow bmp and magnesium.  7. Ectopic atrial arrhythmia - avoiding beta blockers with cocaine use.    DVT prophylaxis: heparin Code Status: full  Family Communication: stepmother at bedside Disposition Plan: home when medically stable  Subjective: Pt complaining of persistent vomiting, nausea and abdominal pain.  She is in tears at times.   Objective: Vitals:   08/31/18 2128 09/01/18 0357 09/01/18 0608  09/01/18 0705  BP: 112/61 (!) 91/47 109/79 125/82  Pulse: (!) 114  98   Resp:   16   Temp: 98.3 F (36.8 C)  98.6 F (37 C)   TempSrc: Oral  Oral   SpO2: 98%  100%   Weight:      Height:        Intake/Output Summary (Last 24 hours) at 09/01/2018 1211 Last data filed at 08/31/2018 2300 Gross per 24 hour  Intake 5985.38 ml  Output -  Net 5985.38 ml   Filed Weights   08/29/18 1620 08/30/18 0100  Weight: 63.5 kg 59.1 kg    REVIEW OF SYSTEMS  As per history otherwise all reviewed and reported negative  Exam:  General exam: awake, mod distress cooperative, in tears at times.  Respiratory system: Clear. No increased work of breathing. Cardiovascular system: S1 & S2 heard. No JVD, murmurs, gallops, clicks or pedal edema. Gastrointestinal system: Abdomen is nondistended, soft and LUQ tenderness, no guarding, and left CVA tenderness. Normal bowel sounds heard. Central nervous system: Alert and oriented. No focal neurological deficits. Extremities: no CCE.  Data Reviewed: Basic Metabolic Panel: Recent Labs  Lab 08/29/18 1817 08/30/18 0655 08/31/18 0503 09/01/18 0634  NA 136 136 136 139  K 3.2* 3.6 3.4* 4.0  CL 100 105 107 115*  CO2 21* 20* 16* 19*  GLUCOSE 78 73 98 104*  BUN 12 12 8 8   CREATININE 1.16* 1.12* 1.03* 1.18*  CALCIUM 10.0 9.1 9.1 8.6*  MG 2.4  --   --   --   PHOS 3.7  --   --   --  Liver Function Tests: Recent Labs  Lab 08/29/18 1817 08/30/18 0655 08/31/18 0503 09/01/18 0634  AST 39 26 20 14*  ALT 30 23 22 16   ALKPHOS 71 53 52 42  BILITOT 0.7 0.5 0.8 0.3  PROT 9.3* 7.2 7.0 5.6*  ALBUMIN 5.2* 3.9 4.1 3.2*   Recent Labs  Lab 08/29/18 1817 08/30/18 0655 08/31/18 0503 09/01/18 0634  LIPASE 85* 48 64* 90*   No results for input(s): AMMONIA in the last 168 hours. CBC: Recent Labs  Lab 08/29/18 1817 08/30/18 0856 08/31/18 0503 09/01/18 0634  WBC 10.7* 10.1 8.7 6.9  NEUTROABS 7.8* 6.7 6.1 3.6  HGB 13.5 12.9 11.4* 10.0*  HCT 44.5 42.8  37.6 33.5*  MCV 74.8* 78.0* 77.2* 77.5*  PLT 316 242 240 206   Cardiac Enzymes: No results for input(s): CKTOTAL, CKMB, CKMBINDEX, TROPONINI in the last 168 hours. CBG (last 3)  No results for input(s): GLUCAP in the last 72 hours. No results found for this or any previous visit (from the past 240 hour(s)).   Studies: [place] Abdomen Complete  Result Date: 08/31/2018 CLINICAL DATA:  Nausea/fevers x3 days EXAM: ABDOMEN ULTRASOUND COMPLETE COMPARISON:  None. FINDINGS: Gallbladder: Surgically absent. Common bile duct: Diameter: 4 mm Liver: No focal lesion identified. Within normal limits in parenchymal echogenicity. Portal vein is patent on color Doppler imaging with normal direction of blood flow towards the liver. IVC: No abnormality visualized. Pancreas: Visualized portion unremarkable. Spleen: Size and appearance within normal limits. Right Kidney: Length: 8.9 cm. Heterogeneous parenchymal echogenicity. No mass or hydronephrosis. Left Kidney: Length: 9.9 cm. Heterogeneous parenchymal echogenicity. No mass or hydronephrosis. Abdominal aorta: No aneurysm visualized. Other findings: None. IMPRESSION: Heterogeneous parenchymal echogenicity of the bilateral kidneys, nonspecific. This appearance may reflect sequela of prior infection/inflammation. No convincing perirenal edema or perinephric fluid to suggest acute inflammation such as pyelonephritis, although ultrasound is nonspecific in this evaluation, and that remains a clinical diagnosis. Status post cholecystectomy. Electronically Signed   By: Charline BillsSriyesh  Krishnan M.D.   On: 08/31/2018 10:25   Scheduled Meds: . enalaprilat  0.625 mg Intravenous Q6H  . heparin  5,000 Units Subcutaneous Q8H  . hydrALAZINE  10 mg Intravenous Q4H  . ondansetron (ZOFRAN) IV  4 mg Intravenous Q6H  . pantoprazole (PROTONIX) IV  40 mg Intravenous Q12H  . promethazine  12.5 mg Intravenous Q8H  . terazosin  2 mg Oral QHS   Continuous Infusions: . 0.9 % NaCl with KCl 20 mEq / L  150 mL/hr at 09/01/18 [id number]    Principal Problem:   Acute pancreatitis Active Problems:   Hypertension   Hypokalemia   Nausea and vomiting   Left upper quadrant pain  Time spent:   Standley Dakinslanford Anusha Claus, MD [facility] 09/01/2018, 12:11 PM    LOS: 0 days

## 2018-09-02 LAB — BASIC METABOLIC PANEL
Anion gap: 8 (ref 5–15)
BUN: <5 mg/dL — ABNORMAL LOW (ref 6–20)
CHLORIDE: 108 mmol/L (ref 98–111)
CO2: 21 mmol/L — ABNORMAL LOW (ref 22–32)
Calcium: 8.7 mg/dL — ABNORMAL LOW (ref 8.9–10.3)
Creatinine, Ser: 1.13 mg/dL — ABNORMAL HIGH (ref 0.44–1.00)
GFR calc non Af Amer: >60 mL/min (ref >60)
Glucose, Bld: 88 mg/dL (ref 70–99)
Potassium: 3.3 mmol/L — ABNORMAL LOW (ref 3.5–5.1)
Sodium: 137 mmol/L (ref 135–145)

## 2018-09-02 LAB — CBC WITH DIFFERENTIAL/PLATELET
ABS IMMATURE GRANULOCYTES: 0.02 10*3/uL (ref 0.00–0.07)
Basophils Absolute: 0.1 10*3/uL (ref 0.0–0.1)
Basophils Relative: 1 %
Eosinophils Absolute: 0.1 10*3/uL (ref 0.0–0.5)
Eosinophils Relative: 1 %
HCT: 33.4 % — ABNORMAL LOW (ref 36.0–46.0)
Hemoglobin: 10.1 g/dL — ABNORMAL LOW (ref 12.0–15.0)
Immature Granulocytes: 0 %
Lymphocytes Relative: 37 %
Lymphs Abs: 2.5 10*3/uL (ref 0.7–4.0)
MCH: 23.3 pg — AB (ref 26.0–34.0)
MCHC: 30.2 g/dL (ref 30.0–36.0)
MCV: 77 fL — ABNORMAL LOW (ref 80.0–100.0)
MONOS PCT: 12 %
Monocytes Absolute: 0.8 10*3/uL (ref 0.1–1.0)
Neutro Abs: 3.4 10*3/uL (ref 1.7–7.7)
Neutrophils Relative %: 49 %
Platelets: 226 10*3/uL (ref 150–400)
RBC: 4.34 MIL/uL (ref 3.87–5.11)
RDW: 15.4 % (ref 11.5–15.5)
WBC: 6.9 10*3/uL (ref 4.0–10.5)
nRBC: 0 % (ref 0.0–0.2)

## 2018-09-02 LAB — LIPASE, BLOOD: LIPASE: 65 U/L — AB (ref 11–51)

## 2018-09-02 LAB — HIV ANTIBODY (ROUTINE TESTING W REFLEX): HIV Screen 4th Generation wRfx: NONREACTIVE

## 2018-09-02 LAB — T3: T3, Total: 95 ng/dL (ref 71–180)

## 2018-09-02 MED ORDER — HYDROMORPHONE HCL 1 MG/ML IJ SOLN
0.5000 mg | INTRAMUSCULAR | Status: DC | PRN
  Administered 2018-09-02 – 2018-09-03 (×4): 0.5 mg via INTRAVENOUS
  Filled 2018-09-02 (×4): qty 0.5

## 2018-09-02 MED ORDER — AMITRIPTYLINE HCL 25 MG PO TABS
25.0000 mg | ORAL_TABLET | Freq: Every day | ORAL | Status: DC
  Administered 2018-09-02: 25 mg via ORAL
  Filled 2018-09-02: qty 1

## 2018-09-02 MED ORDER — HYDROMORPHONE HCL 1 MG/ML IJ SOLN: 0.2500 mg | INTRAMUSCULAR | Status: DC | PRN

## 2018-09-02 MED ORDER — POTASSIUM CHLORIDE 10 MEQ/100ML IV SOLN
10.0000 meq | INTRAVENOUS | Status: AC
  Administered 2018-09-02 (×2): 10 meq via INTRAVENOUS
  Filled 2018-09-02: qty 100

## 2018-09-02 MED ORDER — HYDROMORPHONE HCL 1 MG/ML IJ SOLN
0.2500 mg | INTRAMUSCULAR | Status: DC | PRN
  Administered 2018-09-02 (×2): 0.25 mg via INTRAVENOUS
  Filled 2018-09-02: qty 0.5

## 2018-09-02 MED ORDER — METOCLOPRAMIDE HCL 5 MG/ML IJ SOLN
10.0000 mg | Freq: Four times a day (QID) | INTRAMUSCULAR | Status: DC
  Administered 2018-09-02 – 2018-09-03 (×4): 10 mg via INTRAVENOUS
  Filled 2018-09-02 (×4): qty 2

## 2018-09-02 MED ORDER — PANTOPRAZOLE SODIUM 40 MG PO TBEC: 40.0000 mg | DELAYED_RELEASE_TABLET | Freq: Two times a day (BID) | ORAL | Status: DC

## 2018-09-02 MED ORDER — PROMETHAZINE HCL 25 MG/ML IJ SOLN
12.5000 mg | Freq: Three times a day (TID) | INTRAMUSCULAR | Status: DC | PRN
  Administered 2018-09-02: 12.5 mg via INTRAVENOUS
  Filled 2018-09-02: qty 1

## 2018-09-02 MED ORDER — PANTOPRAZOLE SODIUM 40 MG IV SOLR
40.0000 mg | Freq: Two times a day (BID) | INTRAVENOUS | Status: DC
  Administered 2018-09-02 – 2018-09-03 (×2): 40 mg via INTRAVENOUS
  Filled 2018-09-02 (×2): qty 40

## 2018-09-02 NOTE — Progress Notes (Signed)
Patient reports vomiting immediately after eating "a few bites" of jello.  Patient medicated with zofran.  Will continue to monitor

## 2018-09-02 NOTE — Plan of Care (Signed)

## 2018-09-02 NOTE — Progress Notes (Signed)
Subjective: Abdominal pain improved from yesterday. No N/V overnight. Dilaudid IV approximately every 4 hours. Mom states was told she had chronic pancreatitis at age 28. No prior GI evaluation. Last cocaine a week ago, states no prior cocaine use. Marijuana every few days.   Objective: Vital signs in last 24 hours: Temp:  [98.5 F (36.9 C)-98.8 F (37.1 C)] 98.5 F (36.9 C) (02/04 0519) Pulse Rate:  [105-116] 105 (02/04 0519) Resp:  [15] 15 (02/04 0519) BP: (114-154)/(75-101) 130/101 (02/04 0519) SpO2:  [99 %-100 %] 100 % (02/04 0519) Last BM Date: 08/27/18 General:   Drowsy but easily awakens. Keeps eyes closed during evaluation  Head:  Normocephalic and atraumatic. Abdomen:  Bowel sounds present, soft, mild TTP LUQ and epigastric, no rebound or guarding Neurologic:  Alert and  oriented x4  Intake/Output from previous day: 02/03 0701 - 02/04 0700 In: 3321.6 [I.V.:3321.6] Out: -  Intake/Output this shift: No intake/output data recorded.  Lab Results: Recent Labs    08/31/18 0503 09/01/18 0634 09/02/18 0539  WBC 8.7 6.9 6.9  HGB 11.4* 10.0* 10.1*  HCT 37.6 33.5* 33.4*  PLT 240 206 226   BMET Recent Labs    08/31/18 0503 09/01/18 0634 09/02/18 0539  NA 136 139 137  K 3.4* 4.0 3.3*  CL 107 115* 108  CO2 16* 19* 21*  GLUCOSE 98 104* 88  BUN 8 8 <5*  CREATININE 1.03* 1.18* 1.13*  CALCIUM 9.1 8.6* 8.7*   LFT Recent Labs    08/31/18 0503 09/01/18 0634  PROT 7.0 5.6*  ALBUMIN 4.1 3.2*  AST 20 14*  ALT 22 16  ALKPHOS 52 42  BILITOT 0.8 0.3   Studies/Results: Ct Abdomen Pelvis W Wo Contrast  Result Date: 09/01/2018 CLINICAL DATA:  Fever and vomiting for 4 days. Prior history of pancreatitis. History of cholecystectomy. EXAM: CT ABDOMEN AND PELVIS WITHOUT AND WITH CONTRAST TECHNIQUE: Multidetector CT imaging of the abdomen and pelvis was performed following the standard protocol before and following the bolus administration of intravenous contrast.  CONTRAST:  100mL OMNIPAQUE IOHEXOL 300 MG/ML  SOLN COMPARISON:  Abdominal ultrasound examination 08/31/2018 FINDINGS: Lower chest: The lung bases are clear of acute process. No pleural effusion or pulmonary lesions. The heart is normal in size. No pericardial effusion. The distal esophagus and aorta are unremarkable. Hepatobiliary: No focal hepatic lesions or intrahepatic biliary dilatation. The gallbladder is surgically absent. No common bile duct dilatation. Pancreas: No mass, inflammation or ductal dilatation. Spleen: Normal size.  No focal lesions. Adrenals/Urinary Tract: The adrenal glands and kidneys are unremarkable. The bladder is normal. Stomach/Bowel: The stomach, duodenum, small bowel and colon are unremarkable. No acute inflammatory changes, mass lesions or obstructive findings. The terminal ileum and appendix are normal. Vascular/Lymphatic: The aorta is normal in caliber. No dissection. The branch vessels are patent. The major venous structures are patent. No mesenteric or retroperitoneal mass or adenopathy. Small scattered lymph nodes are noted. A circumaortic left renal vein is noted. Reproductive: The uterus and ovaries are normal. Other: Small amount of simple appearing free pelvic fluid, likely physiologic. Musculoskeletal: No significant bony findings. IMPRESSION: 1. No acute abdominal/pelvic findings, mass lesions or adenopathy. 2. Status post cholecystectomy.  No biliary dilatation. 3. Small amount of free pelvic fluid, likely physiologic. Electronically Signed   By: Rudie MeyerP.  Gallerani M.D.   On: 09/01/2018 15:00   [place] Abdomen Complete  Result Date: 08/31/2018 CLINICAL DATA:  Nausea/fevers x3 days EXAM: ABDOMEN ULTRASOUND COMPLETE COMPARISON:  None. FINDINGS: Gallbladder: Surgically  absent. Common bile duct: Diameter: 4 mm Liver: No focal lesion identified. Within normal limits in parenchymal echogenicity. Portal vein is patent on color Doppler imaging with normal direction of blood flow towards  the liver. IVC: No abnormality visualized. Pancreas: Visualized portion unremarkable. Spleen: Size and appearance within normal limits. Right Kidney: Length: 8.9 cm. Heterogeneous parenchymal echogenicity. No mass or hydronephrosis. Left Kidney: Length: 9.9 cm. Heterogeneous parenchymal echogenicity. No mass or hydronephrosis. Abdominal aorta: No aneurysm visualized. Other findings: None. IMPRESSION: Heterogeneous parenchymal echogenicity of the bilateral kidneys, nonspecific. This appearance may reflect sequela of prior infection/inflammation. No convincing perirenal edema or perinephric fluid to suggest acute inflammation such as pyelonephritis, although ultrasound is nonspecific in this evaluation, and that remains a clinical diagnosis. Status post cholecystectomy. Electronically Signed   By: Charline Bills M.D.   On: 08/31/2018 10:25    Assessment: 28 year old female with reported history of pancreatitis diagnosed approximately age 68, presenting with acute abdominal pain, nausea, and vomiting. Mild improvement this morning. CT with pancreatic protocol yesterday without abnormalities. US abdomen complete without concerning findings. Reviewed CT (non-contrast) from [facility] dated 08/28/2018, which only showed non-obstructing left renal stone. Likely acute symptoms secondary to cocaine and marijuana.    Nausea and vomiting: none overnight. Declined morning dose of Zofran and requested Phenergan instead. Will keep scheduled Zofran and change phenergan to prn.   Hematemesis: no further evidence. Will need EGD as outpatient. Consider EGD as inpatient if CT unrevealing and persistent pain or unable to advance diet.    Plan: Discontinue scheduled phenergan, change to prn Continue scheduled Zofran Continue PPI BID Start clear liquids Patient desires to keep care with [facility] Will need outpatient referral to tertiary care Will continue to follow with you.   Gelene Mink, PhD, ANP-BC [facility]  Gastroenterology    LOS: 1 day    09/02/2018, 9:53 AM

## 2018-09-02 NOTE — Progress Notes (Signed)
PROGRESS NOTE  Stephanie Drake  ZOX:[id number]  DOB: 07/18/1999  DOA: 08/29/2018 PCP: Patient, No Pcp Per  Brief Admission Hx: 28 y.o. female with medical history significant of hypertension, seizure disorder, bipolar disorder, polysubstance abuse, urolithiasis, chronic pancreatitis who presented to the emergency department with complaints of fever with vomiting for the past few days prior to admission.   MDM/Assessment & Plan:   1. Mild acute pancreatitis - Pt reports that she had no emesis in last 12 hours and willing to try clear liquid diet.  Her lipase remains mildly elevated.   Continue IV fluids, antiemetics, liquid diet.  Lipid panel reviewed. CT pancreas protocol ordered by GI team with normal findings.  I really do appreciate assistance of GI team.   2. Intractable nausea and vomiting -trial of clear liquids.  Pregnancy test negative.  TSH low but free T4 within normal limits.  Likely a mild subclinical hypothyroidism.   Suspect patient is in withdrawal from multiple recreational drugs as part of DDx.   3. Flank pain - pt has nephrolithiasis seen on CT scan, IV pain meds ordered as needed.  Renal US unremarkable.  4. Polysubstance abuse - pt tested positive for cocaine, opioids and THC.  Social worker consult for treatment options.  5. Essential hypertension - IV hydralazine and vasotec ordered.  Hold beta blockers due to cocaine positive.  BP better controlled. DC'd vasotec 2/3 due to bump in creatinine.  6. Hypokalemia - repleted.  Follow bmp and magnesium.  7. Ectopic atrial arrhythmia - avoiding beta blockers with cocaine use.    DVT prophylaxis: heparin Code Status: full  Family Communication: stepmother at bedside Disposition Plan: home when medically stable  Subjective: Pt has been n.p.o. since yesterday and had no vomiting.  She is ready to try clear liquids today.  She continues to have abdominal pain although it is improving.  Her nausea seems to be  improving.  Objective: Vitals:   09/01/18 1957 09/01/18 2121 09/02/18 0000 09/02/18 0519  BP: (!) 137/94 134/80 114/75 (!) 130/101  Pulse: (!) 110 (!) 116 (!) 108 (!) 105  Resp:  15  15  Temp:  98.8 F (37.1 C)  98.5 F (36.9 C)  TempSrc:  Oral  Oral  SpO2: 100% 99% 100% 100%  Weight:      Height:        Intake/Output Summary (Last 24 hours) at 09/02/2018 1208 Last data filed at 09/02/2018 [id number] Gross per 24 hour  Intake 3321.63 ml  Output -  Net 3321.63 ml   Filed Weights   08/29/18 1620 08/30/18 0100  Weight: 63.5 kg 59.1 kg   REVIEW OF SYSTEMS  As per history otherwise all reviewed and reported negative  Exam:  General exam: awake, mod distress cooperative, in tears at times.  Respiratory system: Clear. No increased work of breathing. Cardiovascular system: S1 & S2 heard. No JVD, murmurs, gallops, clicks or pedal edema. Gastrointestinal system: Abdomen is nondistended, soft and mild LUQ tenderness, no guarding, and Normal bowel sounds heard. Central nervous system: Alert and oriented. No focal neurological deficits. Extremities: no CCE.  Data Reviewed: Basic Metabolic Panel: Recent Labs  Lab 08/29/18 1817 08/30/18 0655 08/31/18 0503 09/01/18 0634 09/02/18 0539  NA 136 136 136 139 137  K 3.2* 3.6 3.4* 4.0 3.3*  CL 100 105 107 115* 108  CO2 21* 20* 16* 19* 21*  GLUCOSE 78 73 98 104* 88  BUN 12 12 8 8  <5*  CREATININE 1.16* 1.12* 1.03* 1.18* 1.13*  CALCIUM 10.0 9.1 9.1 8.6* 8.7*  MG 2.4  --   --   --   --   PHOS 3.7  --   --   --   --    Liver Function Tests: Recent Labs  Lab 08/29/18 1817 08/30/18 0655 08/31/18 0503 09/01/18 0634  AST 39 26 20 14*  ALT 30 23 22 16   ALKPHOS 71 53 52 42  BILITOT 0.7 0.5 0.8 0.3  PROT 9.3* 7.2 7.0 5.6*  ALBUMIN 5.2* 3.9 4.1 3.2*   Recent Labs  Lab 08/29/18 1817 08/30/18 0655 08/31/18 0503 09/01/18 0634 09/02/18 0539  LIPASE 85* 48 64* 90* 65*   No results for input(s): AMMONIA in the last 168  hours. CBC: Recent Labs  Lab 08/29/18 1817 08/30/18 0856 08/31/18 0503 09/01/18 0634 09/02/18 0539  WBC 10.7* 10.1 8.7 6.9 6.9  NEUTROABS 7.8* 6.7 6.1 3.6 3.4  HGB 13.5 12.9 11.4* 10.0* 10.1*  HCT 44.5 42.8 37.6 33.5* 33.4*  MCV 74.8* 78.0* 77.2* 77.5* 77.0*  PLT 316 242 240 206 226   Cardiac Enzymes: No results for input(s): CKTOTAL, CKMB, CKMBINDEX, TROPONINI in the last 168 hours. CBG (last 3)  No results for input(s): GLUCAP in the last 72 hours. No results found for this or any previous visit (from the past 240 hour(s)).   Studies: Ct Abdomen Pelvis W Wo Contrast  Result Date: 09/01/2018 CLINICAL DATA:  Fever and vomiting for 4 days. Prior history of pancreatitis. History of cholecystectomy. EXAM: CT ABDOMEN AND PELVIS WITHOUT AND WITH CONTRAST TECHNIQUE: Multidetector CT imaging of the abdomen and pelvis was performed following the standard protocol before and following the bolus administration of intravenous contrast. CONTRAST:  OMNIPAQUE IOHEXOL 300 MG/ML  SOLN COMPARISON:  Abdominal ultrasound examination 08/31/2018 FINDINGS: Lower chest: The lung bases are clear of acute process. No pleural effusion or pulmonary lesions. The heart is normal in size. No pericardial effusion. The distal esophagus and aorta are unremarkable. Hepatobiliary: No focal hepatic lesions or intrahepatic biliary dilatation. The gallbladder is surgically absent. No common bile duct dilatation. Pancreas: No mass, inflammation or ductal dilatation. Spleen: Normal size.  No focal lesions. Adrenals/Urinary Tract: The adrenal glands and kidneys are unremarkable. The bladder is normal. Stomach/Bowel: The stomach, duodenum, small bowel and colon are unremarkable. No acute inflammatory changes, mass lesions or obstructive findings. The terminal ileum and appendix are normal. Vascular/Lymphatic: The aorta is normal in caliber. No dissection. The branch vessels are patent. The major venous structures are patent. No  mesenteric or retroperitoneal mass or adenopathy. Small scattered lymph nodes are noted. A circumaortic left renal vein is noted. Reproductive: The uterus and ovaries are normal. Other: Small amount of simple appearing free pelvic fluid, likely physiologic. Musculoskeletal: No significant bony findings. IMPRESSION: 1. No acute abdominal/pelvic findings, mass lesions or adenopathy. 2. Status post cholecystectomy.  No biliary dilatation. 3. Small amount of free pelvic fluid, likely physiologic. Electronically Signed   By: Rudie Meyer M.D.   On: 09/01/2018 15:00   Scheduled Meds: . heparin  5,000 Units Subcutaneous Q8H  . hydrALAZINE  10 mg Intravenous Q4H  . ondansetron (ZOFRAN) IV  4 mg Intravenous Q6H  . pantoprazole (PROTONIX) IV  40 mg Intravenous Q12H  . promethazine  12.5 mg Intravenous Q8H  . terazosin  2 mg Oral QHS   Continuous Infusions: . 0.9 % NaCl with KCl 20 mEq / L 75 mL/hr at 09/02/18 8088   Principal Problem:   Acute pancreatitis Active Problems:  Hypertension   Hypokalemia   Nausea and vomiting   Left upper quadrant pain   Acute on chronic pancreatitis (HCC)  Time spent:   Standley Dakinslanford , MD [facility] 09/02/2018, 12:08 PM    LOS: 1 day

## 2018-09-03 ENCOUNTER — Telehealth: Payer: Self-pay | Admitting: General Practice

## 2018-09-03 DIAGNOSIS — N200 Calculus of kidney: Secondary | ICD-10-CM

## 2018-09-03 LAB — CBC WITH DIFFERENTIAL/PLATELET
Abs Immature Granulocytes: 0.02 10*3/uL (ref 0.00–0.07)
BASOS PCT: 1 %
Basophils Absolute: 0 10*3/uL (ref 0.0–0.1)
Eosinophils Absolute: 0 10*3/uL (ref 0.0–0.5)
Eosinophils Relative: 1 %
HCT: 33.6 % — ABNORMAL LOW (ref 36.0–46.0)
Hemoglobin: 10.3 g/dL — ABNORMAL LOW (ref 12.0–15.0)
Immature Granulocytes: 0 %
Lymphocytes Relative: 36 %
Lymphs Abs: 2.4 10*3/uL (ref 0.7–4.0)
MCH: 23.5 pg — ABNORMAL LOW (ref 26.0–34.0)
MCHC: 30.7 g/dL (ref 30.0–36.0)
MCV: 76.5 fL — ABNORMAL LOW (ref 80.0–100.0)
Monocytes Absolute: 0.9 10*3/uL (ref 0.1–1.0)
Monocytes Relative: 14 %
NEUTROS ABS: 3.3 10*3/uL (ref 1.7–7.7)
Neutrophils Relative %: 48 %
Platelets: 226 10*3/uL (ref 150–400)
RBC: 4.39 MIL/uL (ref 3.87–5.11)
RDW: 15.5 % (ref 11.5–15.5)
WBC: 6.6 10*3/uL (ref 4.0–10.5)
nRBC: 0 % (ref 0.0–0.2)

## 2018-09-03 LAB — COMPREHENSIVE METABOLIC PANEL
ALT: 16 U/L (ref 0–44)
AST: 14 U/L — ABNORMAL LOW (ref 15–41)
Albumin: 3.7 g/dL (ref 3.5–5.0)
Alkaline Phosphatase: 43 U/L (ref 38–126)
Anion gap: 6 (ref 5–15)
BUN: 6 mg/dL (ref 6–20)
CHLORIDE: 105 mmol/L (ref 98–111)
CO2: 25 mmol/L (ref 22–32)
CREATININE: 1.07 mg/dL — AB (ref 0.44–1.00)
Calcium: 9.2 mg/dL (ref 8.9–10.3)
GFR calc Af Amer: >60 mL/min (ref >60)
GFR calc non Af Amer: >60 mL/min (ref >60)
Glucose, Bld: 114 mg/dL — ABNORMAL HIGH (ref 70–99)
Potassium: 3.3 mmol/L — ABNORMAL LOW (ref 3.5–5.1)
SODIUM: 136 mmol/L (ref 135–145)
Total Bilirubin: 0.2 mg/dL — ABNORMAL LOW (ref 0.3–1.2)
Total Protein: 6.5 g/dL (ref 6.5–8.1)

## 2018-09-03 LAB — HEMOGLOBIN A1C
HEMOGLOBIN A1C: 5.5 % (ref 4.8–5.6)
Mean Plasma Glucose: 111.15 mg/dL

## 2018-09-03 LAB — CORTISOL-AM, BLOOD: Cortisol - AM: 15.7 ug/dL (ref 6.7–22.6)

## 2018-09-03 NOTE — Clinical Social Work Note (Addendum)
Clinical Social Work Assessment  Patient Details  Name: Stephanie Drake MRN: [id number] Date of Birth: 12-Jan-1991  Date of referral:  09/02/18               Reason for consult:  Substance Use/ETOH Abuse                Permission sought to share information with:  Family Supports Permission granted to share information::  Yes, Verbal Permission Granted  Name::     Stephanie Drake  Agency::     Relationship::  mother  Contact Information:     Housing/Transportation Living arrangements for the past 2 months:  No permanent address Source of Information:  Patient, Parent Patient Interpreter Needed:  None Criminal Activity/Legal Involvement Pertinent to Current Situation/Hospitalization:  No - Comment as needed Significant Relationships:  Parents, Other Family Members Lives with:  Relatives Do you feel safe going back to the place where you live?    Need for family participation in patient care:  Yes (Comment)  Care giving concerns:  Patient states, and mother concurs, that there is not an on-going substance abuse issue of concern.  Pt admits to regular cannabis use "when it is available," but denies on-going use of cocaine, opiates.   Social Worker assessment / plan:  Stephanie Drake, as I called her and was not corrected by she nor her mother, was in the shower when I initially checked in.  I met "Stephanie Drake," her mother, who called me back into the room when patient was back in bed.  Pt presented as non-engaged, c/o pain and split her time between layng back with her eyes closed and sitting up and rocking.  She gave permission to speak to her mother, and was minimally engaged throughout.  When pressed, she denied on-going substance abuse issues, stating "it's not a thing."  Mother, speaking for daughter much of the time, stated that her mother smokes cannabis regularly, "when available," and that the cocaine was was one time incident "when a friend of the family came over and a party broke out."  Pt and  mother denied opiate use, other than prescribed, and mother produced a sheet of medications prescribed recently at another hospital. Mother states she and patient recently left [place] when she and her husband split up, and came to [place] to stay with family.  Apparently, they were initially in [place], but only landed there briefly.  Mother states patient was diagnosed about the age of 8, that she has never worked, and has more times of feeling bad than feeling good.  Mother is not working either, and so both are currently dependent on family for support.  She states they are exploring moving to [place] to be closer to medical care here.    Employment status:  Unemployed Forensic scientist:  Catering manager PT Recommendations:  Not assessed at this time Information / Referral to community resources:     Patient/Family's Response to care:  Pt unengaged.  Mother engaged and appreciative.  We talked about applying for disability should patient's medical issues get in the way of ability to work.  Patient/Family's Understanding of and Emotional Response to Diagnosis, Current Treatment, and Prognosis:  Difficult to gauge.  Emotional Assessment Appearance:  Appears stated age Attitude/Demeanor/Rapport:  Avoidant Affect (typically observed):  Flat, Restless Orientation:  Oriented to Self, Oriented to Situation, Oriented to Place, Oriented to  Time Alcohol / Substance use:  Illicit Drugs Psych involvement (Current and /or in the community):  No (Comment)  Discharge Needs  Concerns to be addressed:  Financial / Insurance Concerns Readmission within the last 30 days:  No Current discharge risk:  Substance Abuse Barriers to Discharge:  No Barriers Identified   Trish Mage, LCSW 09/03/2018, 8:34 AM

## 2018-09-03 NOTE — Telephone Encounter (Signed)
Routing to Dr. Darrick Penna for discharge order

## 2018-09-03 NOTE — Progress Notes (Signed)
Subjective:  Patient sound asleep when entering the room. Sharing bed with adult female. States she is starting to feel hungry. Last episode of vomiting yesterday late afternoon,early evening. Abdominal pain luq improved. No BM in one week.   Objective: Vital signs in last 24 hours: Temp:  [98.2 F (36.8 C)-99.5 F (37.5 C)] 98.2 F (36.8 C) (02/05 0603) Pulse Rate:  [111-126] 126 (02/05 0603) Resp:  [18] 18 (02/05 0603) BP: (132-160)/(84-128) 134/84 (02/05 0603) SpO2:  [99 %-100 %] 100 % (02/05 0603) Last BM Date: 08/27/18 General:   Drowsy and falls back to sleep when questioning.  NAD Head:  Normocephalic and atraumatic. Eyes:  Sclera clear, no icterus.  Abdomen:  Soft, nontender and nondistended. Normal bowel sounds, without guarding, and without rebound.   Extremities:  Without clubbing, deformity or edema.  Psych:  Alert and cooperative. Normal mood and affect.  Intake/Output from previous day: 02/04 0701 - 02/05 0700 In: 1467.7 [I.V.:1467.7] Out: -  Intake/Output this shift: No intake/output data recorded.  Lab Results: CBC Recent Labs    09/01/18 0634 09/02/18 0539 09/03/18 0601  WBC 6.9 6.9 6.6  HGB 10.0* 10.1* 10.3*  HCT 33.5* 33.4* 33.6*  MCV 77.5* 77.0* 76.5*  PLT 206 226 226   BMET Recent Labs    09/01/18 0634 09/02/18 0539 09/03/18 0601  NA 139 137 136  K 4.0 3.3* 3.3*  CL 115* 108 105  CO2 19* 21* 25  GLUCOSE 104* 88 114*  BUN 8 <5* 6  CREATININE 1.18* 1.13* 1.07*  CALCIUM 8.6* 8.7* 9.2   LFTs Recent Labs    09/01/18 0634 09/03/18 0601  BILITOT 0.3 0.2*  ALKPHOS 42 43  AST 14* 14*  ALT 16 16  PROT 5.6* 6.5  ALBUMIN 3.2* 3.7   Recent Labs    09/01/18 0634 09/02/18 0539  LIPASE 90* 65*   PT/INR No results for input(s): LABPROT, INR in the last 72 hours.    Imaging Studies: Ct Abdomen Pelvis W Wo Contrast  Result Date: 09/01/2018 CLINICAL DATA:  Fever and vomiting for 4 days. Prior history of pancreatitis. History of  cholecystectomy. EXAM: CT ABDOMEN AND PELVIS WITHOUT AND WITH CONTRAST TECHNIQUE: Multidetector CT imaging of the abdomen and pelvis was performed following the standard protocol before and following the bolus administration of intravenous contrast. CONTRAST:  100mL OMNIPAQUE IOHEXOL 300 MG/ML  SOLN COMPARISON:  Abdominal ultrasound examination 08/31/2018 FINDINGS: Lower chest: The lung bases are clear of acute process. No pleural effusion or pulmonary lesions. The heart is normal in size. No pericardial effusion. The distal esophagus and aorta are unremarkable. Hepatobiliary: No focal hepatic lesions or intrahepatic biliary dilatation. The gallbladder is surgically absent. No common bile duct dilatation. Pancreas: No mass, inflammation or ductal dilatation. Spleen: Normal size.  No focal lesions. Adrenals/Urinary Tract: The adrenal glands and kidneys are unremarkable. The bladder is normal. Stomach/Bowel: The stomach, duodenum, small bowel and colon are unremarkable. No acute inflammatory changes, mass lesions or obstructive findings. The terminal ileum and appendix are normal. Vascular/Lymphatic: The aorta is normal in caliber. No dissection. The branch vessels are patent. The major venous structures are patent. No mesenteric or retroperitoneal mass or adenopathy. Small scattered lymph nodes are noted. A circumaortic left renal vein is noted. Reproductive: The uterus and ovaries are normal. Other: Small amount of simple appearing free pelvic fluid, likely physiologic. Musculoskeletal: No significant bony findings. IMPRESSION: 1. No acute abdominal/pelvic findings, mass lesions or adenopathy. 2. Status post cholecystectomy.  No biliary dilatation. 3.  Small amount of free pelvic fluid, likely physiologic. Electronically Signed   By: Rudie MeyerP.  Gallerani M.D.   On: 09/01/2018 15:00   Ct Head Wo Contrast  Result Date: 08/29/2018 CLINICAL DATA:  Headache, dizziness and lethargy for 4 days. Fever and vomiting. History of  kidney infection. EXAM: CT HEAD WITHOUT CONTRAST TECHNIQUE: Contiguous axial images were obtained from the base of the skull through the vertex without intravenous contrast. COMPARISON:  None. FINDINGS: BRAIN: No intraparenchymal hemorrhage, mass effect nor midline shift. The ventricles and sulci are normal. No acute large vascular territory infarcts. No abnormal extra-axial fluid collections. Basal cisterns are patent. VASCULAR: Unremarkable. SKULL/SOFT TISSUES: No skull fracture. No significant soft tissue swelling. ORBITS/SINUSES: The included ocular globes and orbital contents are normal.Trace paranasal sinus mucosal thickening. Mastoid air cells are well aerated. OTHER: None. IMPRESSION: Normal CT HEAD without contrast. Electronically Signed   By: Awilda Metroourtnay  Bloomer M.D.   On: 08/29/2018 20:34   [place] Abdomen Complete  Result Date: 08/31/2018 CLINICAL DATA:  Nausea/fevers x3 days EXAM: ABDOMEN ULTRASOUND COMPLETE COMPARISON:  None. FINDINGS: Gallbladder: Surgically absent. Common bile duct: Diameter: 4 mm Liver: No focal lesion identified. Within normal limits in parenchymal echogenicity. Portal vein is patent on color Doppler imaging with normal direction of blood flow towards the liver. IVC: No abnormality visualized. Pancreas: Visualized portion unremarkable. Spleen: Size and appearance within normal limits. Right Kidney: Length: 8.9 cm. Heterogeneous parenchymal echogenicity. No mass or hydronephrosis. Left Kidney: Length: 9.9 cm. Heterogeneous parenchymal echogenicity. No mass or hydronephrosis. Abdominal aorta: No aneurysm visualized. Other findings: None. IMPRESSION: Heterogeneous parenchymal echogenicity of the bilateral kidneys, nonspecific. This appearance may reflect sequela of prior infection/inflammation. No convincing perirenal edema or perinephric fluid to suggest acute inflammation such as pyelonephritis, although ultrasound is nonspecific in this evaluation, and that remains a clinical  diagnosis. Status post cholecystectomy. Electronically Signed   By: Charline BillsSriyesh  Krishnan M.D.   On: 08/31/2018 10:25   Dg Abdomen Acute W/chest  Result Date: 08/29/2018 CLINICAL DATA:  Fever and vomiting for the past 4 days. EXAM: DG ABDOMEN ACUTE W/ 1V CHEST COMPARISON:  None. FINDINGS: There is no evidence of dilated bowel loops or free intraperitoneal air. 3 mm calcification in the lower pole of the left kidney is noted. Heart size and mediastinal contours are within normal limits. Both lungs are clear. IMPRESSION: Unremarkable bowel gas pattern. A 3 mm calcification projects over the lower pole the left kidney suspicious for left renal calculus. No acute cardiopulmonary disease. Electronically Signed   By: Tollie Ethavid  Kwon M.D.   On: 08/29/2018 21:06  [2 weeks]   Assessment:  28 year old female with reported history of pancreatitis diagnosed approximately at age 28, presenting with acute abdominal pain associated with nausea and vomiting.  CT with pancreatic protocol this admission without abnormalities.  Ultrasound abdomen without significant findings.  [facility]2018 showed only nonobstructing left renal stone.  Symptoms felt could be related to cocaine and marijuana use. Other testing pending for adrenal insufficiency and AIP.   Overnight it was determined patient registered this admission under a different alias. Note change in name and DOB.   Plan: 1. Continue PPI BID. Scheduled zofran/reglan. 2. F/u pending labs as available.  3. Resume clear liquid diet.  4. Consider outpatient EGD+/-esophageal dilation.  Leanna BattlesLeslie S. Dixon BoosLewis, PA-C [facility] [phone] 2/5/20209:28 AM     LOS: 2 days

## 2018-09-03 NOTE — Discharge Summary (Signed)
Physician Discharge Summary  Stephanie Drake ZOX:[id number] DOB: 04/06/1991 DOA: 08/29/2018  PCP: Patient, No Pcp Per  Admit date: 08/29/2018 Discharge date: 09/03/2018  Time spent: 25 minutes  Recommendations for Outpatient Follow-up:  1. Follow-up porphyrins results 2. Reassess ability keeping things down and make sure patient has follow-up with gastroenterology service as an outpatient for endoscopic examination, dilatation if needed and further motility assessment.   Discharge Diagnoses:  Principal Problem:   Acute pancreatitis Active Problems:   Hypertension   Hypokalemia   Nausea and vomiting   Left upper quadrant pain   Acute on chronic pancreatitis [facility](HCC)   Discharge Condition: Stable and improved.  Patient ended up leaving AGAINST MEDICAL ADVICE and now waiting for any prescriptions for further instructions at discharge.  Diet recommendation: Small multiple meals has been recommended.  Filed Weights   08/29/18 1620 08/30/18 0100  Weight: 63.5 kg 59.1 kg    History of present illness:  28 y.o. female with medical history significant of hypertension, seizure disorder, bipolar disorder, urolithiasis, chronic pancreatitis who is coming to the emergency department with complaints of fever with vomiting for the past 4 days.  [facility] and was diagnosed with a renal infection and given Bactrim and tramadol.  The patient's stepmother stated that she has not been able to do much, but has had further vomiting.  She has not been talking much and all of the history is given by her stepmother.  ED Course: Initial vital signs temperature 99.2 F, pulse 107, respirations 18, blood pressure 161/123 mmHg and O2 sat 99% on room air.  The patient received a 1000 mL NS bolus, Zofran 4 mg and Phenergan 12.5 mg IVP.  White count was 10.7, hemoglobin 13.5 g/dL and platelets 811316.  CMP shows a potassium of 3.2 and CO2 21 mmol/L.  All other electrolytes are normal.   Creatinine was 1.16 mg/dL.  Total protein 9.3 and albumin 5.2 g/dL.  LFTs are within normal limits.  Lipase was 85 units/L.  Alcohol, salicylate and acetaminophen are within normal limits.  Imaging: Chest radiograph does not show any acute cardiopulmonary pathology.  CT head was normal.  Results from a CT abdomen/pelvis from [facility] show urolithiasis on the left.  Hospital Course:  1-acute on chronic pancreatitis -Lipase trended down -By the time she left AGAINST MEDICAL ADVICE was able to tolerate clear liquid diet and no actively experiencing nausea or vomiting -Small multiple meals and low-fat diet recommended. -GI recommended initiation of Reglan and as needed antiemetics (Zofran); with intention to treat underlying gastroparesis/dysmotility disorder. -Patient left the hospital AGAINST MEDICAL ADVICE and did not receive any prescriptions at discharge. -While inpatient she was kept on PPIs.  2-hypertension -Blood pressure stable without discharge -No prior history of elevated blood pressure -Pain most likely contributing with her abnormal vital signs -Will recommend reassess blood pressure at follow-up visit and starting antihypertensive agents as needed.  3-hypokalemia -In the setting of GI losses and poor oral intake -Repleted and 3.3-4.0 range at discharge. -Recommend basic metabolic panel follow-up visit to reassess electrolytes trend.  Procedures:  See below for x-ray reports.  Consultations:  Gastroenterology  Discharge Exam: Vitals:   09/03/18 0325 09/03/18 0603  BP: (!) 132/99 134/84  Pulse:  (!) 126  Resp:  18  Temp:  98.2 F (36.8 C)  SpO2:  100%    General: Afebrile, no chest pain, no shortness of breath.  Patient reports very vague abdominal discomfort and currently no active nausea or  vomiting.  She was able to tolerate clear liquid diet without problems. Cardiovascular: S1 and S2, no rubs, no gallops, no murmurs. Respiratory: Good air movement  bilaterally, no wheezing, no crackles.  Good O2 sat on room air. Abdomen: Soft, no guarding, positive bowel sounds; vague diffuse discomfort on palpation.  Discharge Instructions   Allergies  Allergen Reactions  . Amoxicillin     Hives     The results of significant diagnostics from this hospitalization (including imaging, microbiology, ancillary and laboratory) are listed below for reference.    Significant Diagnostic Studies: Ct Abdomen Pelvis W Wo Contrast  Result Date: 09/01/2018 CLINICAL DATA:  Fever and vomiting for 4 days. Prior history of pancreatitis. History of cholecystectomy. EXAM: CT ABDOMEN AND PELVIS WITHOUT AND WITH CONTRAST TECHNIQUE: Multidetector CT imaging of the abdomen and pelvis was performed following the standard protocol before and following the bolus administration of intravenous contrast. CONTRAST:  OMNIPAQUE IOHEXOL 300 MG/ML  SOLN COMPARISON:  Abdominal ultrasound examination 08/31/2018 FINDINGS: Lower chest: The lung bases are clear of acute process. No pleural effusion or pulmonary lesions. The heart is normal in size. No pericardial effusion. The distal esophagus and aorta are unremarkable. Hepatobiliary: No focal hepatic lesions or intrahepatic biliary dilatation. The gallbladder is surgically absent. No common bile duct dilatation. Pancreas: No mass, inflammation or ductal dilatation. Spleen: Normal size.  No focal lesions. Adrenals/Urinary Tract: The adrenal glands and kidneys are unremarkable. The bladder is normal. Stomach/Bowel: The stomach, duodenum, small bowel and colon are unremarkable. No acute inflammatory changes, mass lesions or obstructive findings. The terminal ileum and appendix are normal. Vascular/Lymphatic: The aorta is normal in caliber. No dissection. The branch vessels are patent. The major venous structures are patent. No mesenteric or retroperitoneal mass or adenopathy. Small scattered lymph nodes are noted. A circumaortic left renal  vein is noted. Reproductive: The uterus and ovaries are normal. Other: Small amount of simple appearing free pelvic fluid, likely physiologic. Musculoskeletal: No significant bony findings. IMPRESSION: 1. No acute abdominal/pelvic findings, mass lesions or adenopathy. 2. Status post cholecystectomy.  No biliary dilatation. 3. Small amount of free pelvic fluid, likely physiologic. Electronically Signed   By: Rudie Meyer M.D.   On: 09/01/2018 15:00   Ct Head Wo Contrast  Result Date: 08/29/2018 CLINICAL DATA:  Headache, dizziness and lethargy for 4 days. Fever and vomiting. History of kidney infection. EXAM: CT HEAD WITHOUT CONTRAST TECHNIQUE: Contiguous axial images were obtained from the base of the skull through the vertex without intravenous contrast. COMPARISON:  None. FINDINGS: BRAIN: No intraparenchymal hemorrhage, mass effect nor midline shift. The ventricles and sulci are normal. No acute large vascular territory infarcts. No abnormal extra-axial fluid collections. Basal cisterns are patent. VASCULAR: Unremarkable. SKULL/SOFT TISSUES: No skull fracture. No significant soft tissue swelling. ORBITS/SINUSES: The included ocular globes and orbital contents are normal.Trace paranasal sinus mucosal thickening. Mastoid air cells are well aerated. OTHER: None. IMPRESSION: Normal CT HEAD without contrast. Electronically Signed   By: Awilda Metro M.D.   On: 08/29/2018 20:34   US Abdomen Complete  Result Date: 08/31/2018 CLINICAL DATA:  Nausea/fevers x3 days EXAM: ABDOMEN ULTRASOUND COMPLETE COMPARISON:  None. FINDINGS: Gallbladder: Surgically absent. Common bile duct: Diameter: 4 mm Liver: No focal lesion identified. Within normal limits in parenchymal echogenicity. Portal vein is patent on color Doppler imaging with normal direction of blood flow towards the liver. IVC: No abnormality visualized. Pancreas: Visualized portion unremarkable. Spleen: Size and appearance within normal limits. Right Kidney:  Length: 8.9 cm.  Heterogeneous parenchymal echogenicity. No mass or hydronephrosis. Left Kidney: Length: 9.9 cm. Heterogeneous parenchymal echogenicity. No mass or hydronephrosis. Abdominal aorta: No aneurysm visualized. Other findings: None. IMPRESSION: Heterogeneous parenchymal echogenicity of the bilateral kidneys, nonspecific. This appearance may reflect sequela of prior infection/inflammation. No convincing perirenal edema or perinephric fluid to suggest acute inflammation such as pyelonephritis, although ultrasound is nonspecific in this evaluation, and that remains a clinical diagnosis. Status post cholecystectomy. Electronically Signed   By: Charline BillsSriyesh  Krishnan M.D.   On: 08/31/2018 10:25   Dg Abdomen Acute W/chest  Result Date: 08/29/2018 CLINICAL DATA:  Fever and vomiting for the past 4 days. EXAM: DG ABDOMEN ACUTE W/ 1V CHEST COMPARISON:  None. FINDINGS: There is no evidence of dilated bowel loops or free intraperitoneal air. 3 mm calcification in the lower pole of the left kidney is noted. Heart size and mediastinal contours are within normal limits. Both lungs are clear. IMPRESSION: Unremarkable bowel gas pattern. A 3 mm calcification projects over the lower pole the left kidney suspicious for left renal calculus. No acute cardiopulmonary disease. Electronically Signed   By: Tollie Ethavid  Kwon M.D.   On: 08/29/2018 21:06   Labs: Basic Metabolic Panel: Recent Labs  Lab 08/29/18 1817 08/30/18 0655 08/31/18 0503 09/01/18 0634 09/02/18 0539 09/03/18 0601  NA 136 136 136 139 137 136  K 3.2* 3.6 3.4* 4.0 3.3* 3.3*  CL 100 105 107 115* 108 105  CO2 21* 20* 16* 19* 21* 25  GLUCOSE 78 73 98 104* 88 114*  BUN 12 12 8 8  <5* 6  CREATININE 1.16* 1.12* 1.03* 1.18* 1.13* 1.07*  CALCIUM 10.0 9.1 9.1 8.6* 8.7* 9.2  MG 2.4  --   --   --   --   --   PHOS 3.7  --   --   --   --   --    Liver Function Tests: Recent Labs  Lab 08/29/18 1817 08/30/18 0655 08/31/18 0503 09/01/18 0634 09/03/18 0601  AST  39 26 20 14* 14*  ALT 30 23 22 16 16   ALKPHOS 71 53 52 42 43  BILITOT 0.7 0.5 0.8 0.3 0.2*  PROT 9.3* 7.2 7.0 5.6* 6.5  ALBUMIN 5.2* 3.9 4.1 3.2* 3.7   Recent Labs  Lab 08/29/18 1817 08/30/18 0655 08/31/18 0503 09/01/18 0634 09/02/18 0539  LIPASE 85* 48 64* 90* 65*   CBC: Recent Labs  Lab 08/30/18 0856 08/31/18 0503 09/01/18 0634 09/02/18 0539 09/03/18 0601  WBC 10.1 8.7 6.9 6.9 6.6  NEUTROABS 6.7 6.1 3.6 3.4 3.3  HGB 12.9 11.4* 10.0* 10.1* 10.3*  HCT 42.8 37.6 33.5* 33.4* 33.6*  MCV 78.0* 77.2* 77.5* 77.0* 76.5*  PLT 242 240 206 226 226    Signed:  Vassie Lollarlos Amando Chaput MD.  [facility] 09/03/2018, 2:24 PM

## 2018-09-03 NOTE — Plan of Care (Signed)

## 2018-09-03 NOTE — Progress Notes (Signed)
Patient came to nurses station dressed and ready to leave. She states she removed her IV and she was going to leave.  Explained to patient that there were no orders to discharge her as of yet or any discharge instructions.  Patient states she does not care and is leaving.  AMA paperwork signed.  Patient left hospital with her significant other and 1 other person.

## 2018-09-03 NOTE — Telephone Encounter (Signed)
PT OBTAINED MEDICAL CARE UNDER FALSE PRETENSES. DISCHARGE FROM PRACTICE DUE TO UNPRODUCTIVE PHYSICIAN PATIENT RELATIONSHIP.

## 2018-09-04 ENCOUNTER — Encounter: Payer: Self-pay | Admitting: General Practice

## 2018-09-04 NOTE — Telephone Encounter (Signed)
Discharge letter mailed  

## 2018-09-07 ENCOUNTER — Telehealth: Payer: Self-pay | Admitting: Internal Medicine

## 2018-09-07 NOTE — Telephone Encounter (Signed)
I was called about this pt last evening.  Person called identified herself as Shakeda Keinath who further identified herself as the adult female in the bed with the patient last week when I rounded on pt.  Although she told me last week she was the pts mother, she told me last evening she was really the pts WIFE.  She apologized for lying to me.  She was concerned pt was having ongoing sx of nausea and vomiting and requested Reglan.  I pointed out the patient left the hospital against medical advise before work-up was complete.  I informed her I would not be able to call in a prescription as her work-up was not complete.  She asked me about getting medical records.  I told her to contact the hospital during regular business hours and request them. She thanked me for this information and call was concluded.

## 2018-09-08 NOTE — Telephone Encounter (Signed)
Noted  

## 2018-09-17 NOTE — Telephone Encounter (Signed)
REVIEWED-NO ADDITIONAL RECOMMENDATIONS. 

## 2019-10-15 ENCOUNTER — Other Ambulatory Visit (HOSPITAL_COMMUNITY): Payer: Self-pay | Admitting: Emergency Medicine

## 2019-10-15 ENCOUNTER — Emergency Department (HOSPITAL_COMMUNITY): Admission: RE | Admit: 2019-10-15 | Payer: Medicaid - Out of State | Source: Ambulatory Visit

## 2019-10-15 ENCOUNTER — Other Ambulatory Visit: Payer: Self-pay

## 2019-10-15 DIAGNOSIS — G4459 Other complicated headache syndrome: Secondary | ICD-10-CM

## 2020-06-30 IMAGING — CT CT ABD-PEL WO/W CM
2 of 9 series · 14 of 46 positions shown, 16 images · IV contrast (Isovue)
Comparison: Abdominal ultrasound examination 08/31/2018

CLINICAL DATA: Fever and vomiting for 4 days. Prior history of
pancreatitis. History of cholecystectomy.

EXAM:
CT ABDOMEN AND PELVIS WITHOUT AND WITH CONTRAST
TECHNIQUE: Multidetector CT imaging of the abdomen and pelvis was performed
following the standard protocol before and following the bolus
administration of intravenous contrast.
CONTRAST:  100mL OMNIPAQUE IOHEXOL 300 MG/ML  SOLN

[Series 10: portal thin · axial · portal-venous · 0.63mm/px · z∈[+929,+1273]mm · 11 of 208 slices shown, 13 images]
[im 18/208  soft-tissue]
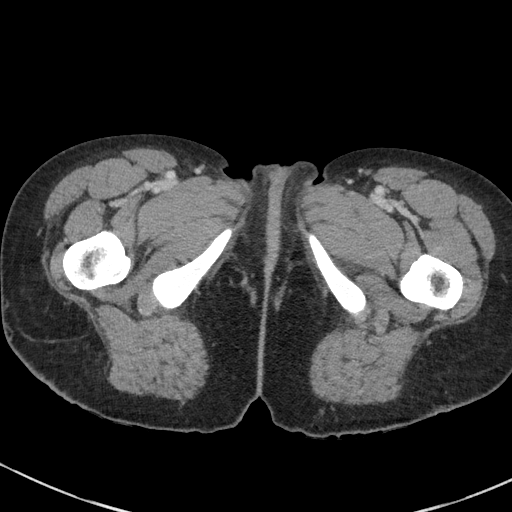
[im 18/208  bone]
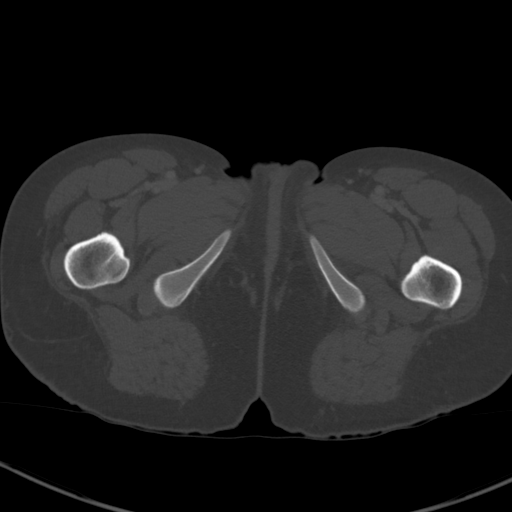
[im 35/208  soft-tissue]
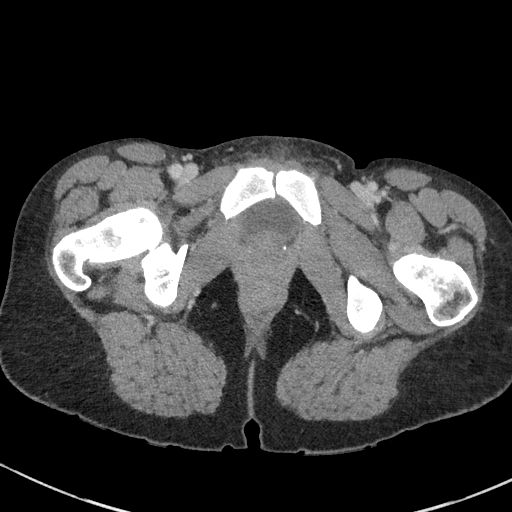
[im 52/208  soft-tissue]
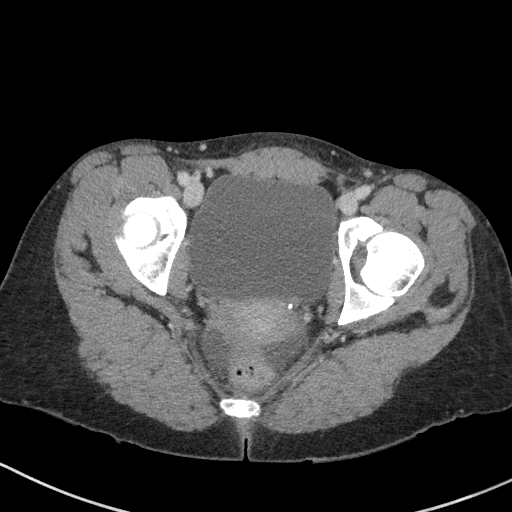
[im 70/208  soft-tissue]
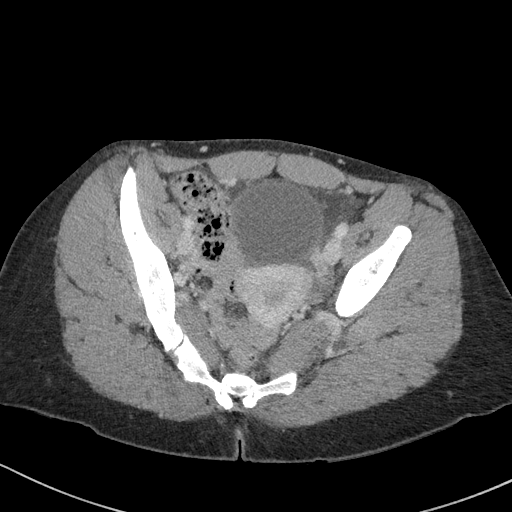
[im 87/208  soft-tissue]
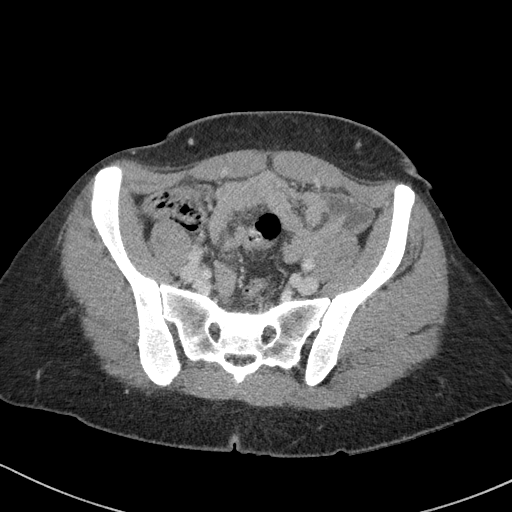
[im 104/208  soft-tissue]
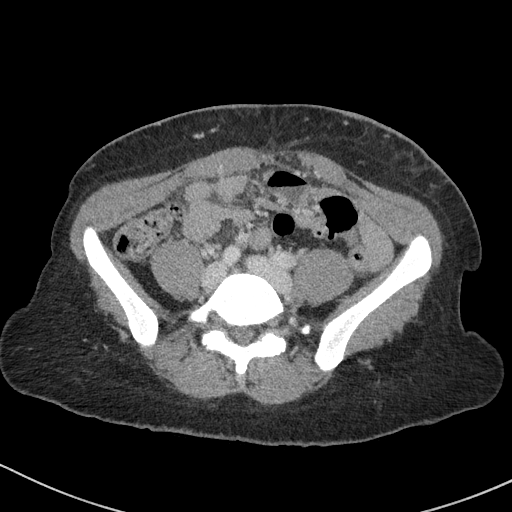
[im 121/208  soft-tissue]
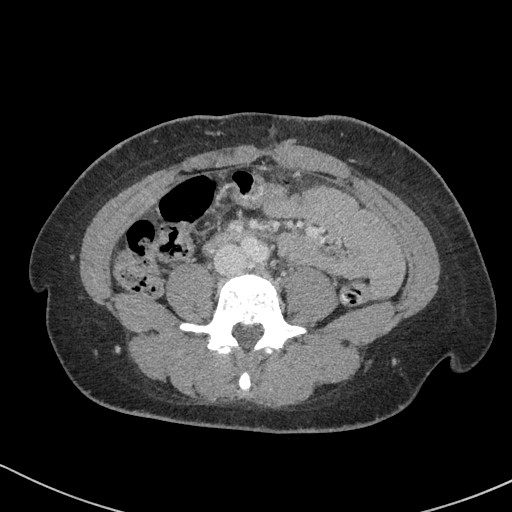
[im 139/208  soft-tissue]
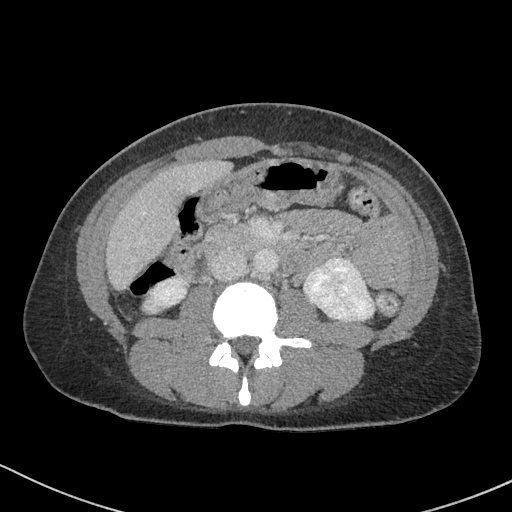
[im 156/208  soft-tissue]
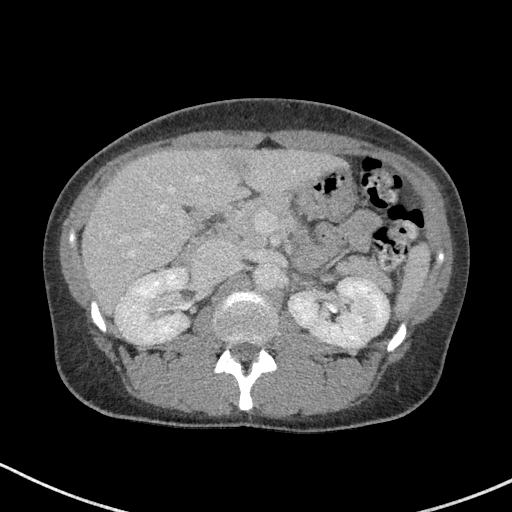
[im 156/208  bone]
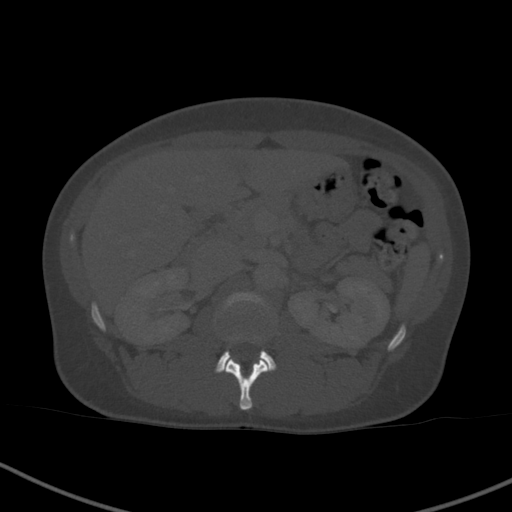
[im 173/208  soft-tissue]
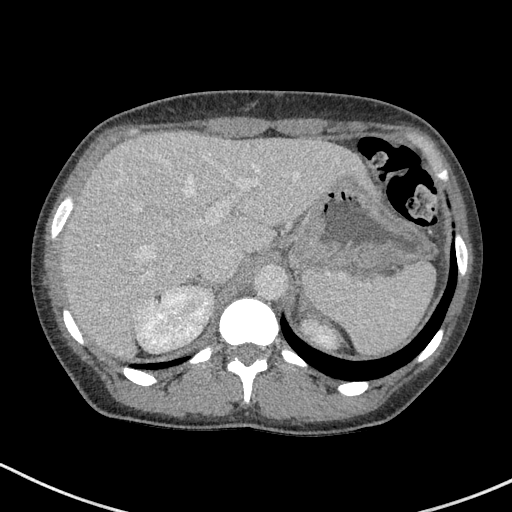
[im 190/208  soft-tissue]
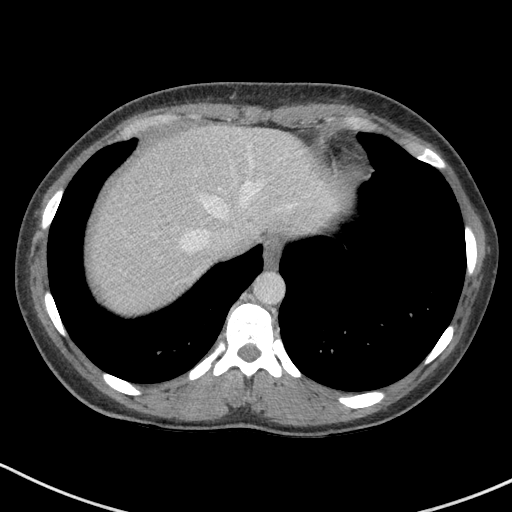

[Series 11: coronal arterial · coronal · arterial · 0.41mm/px · 3 of 84 slices shown]
[im 21/84  soft-tissue]
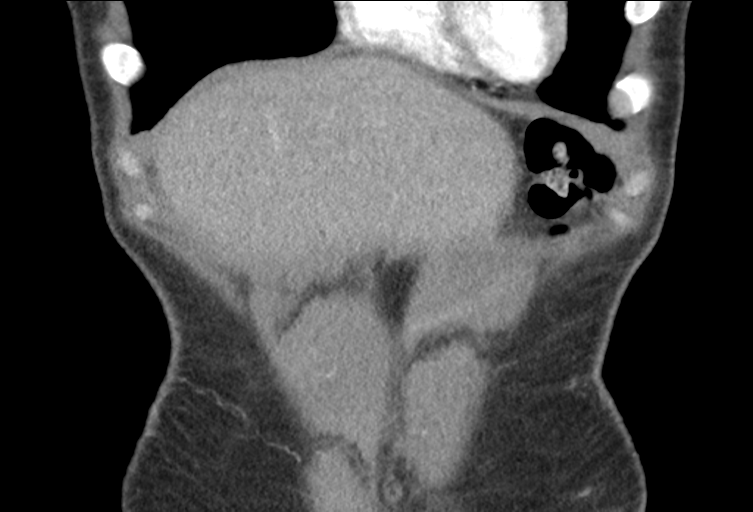
[im 42/84  soft-tissue]
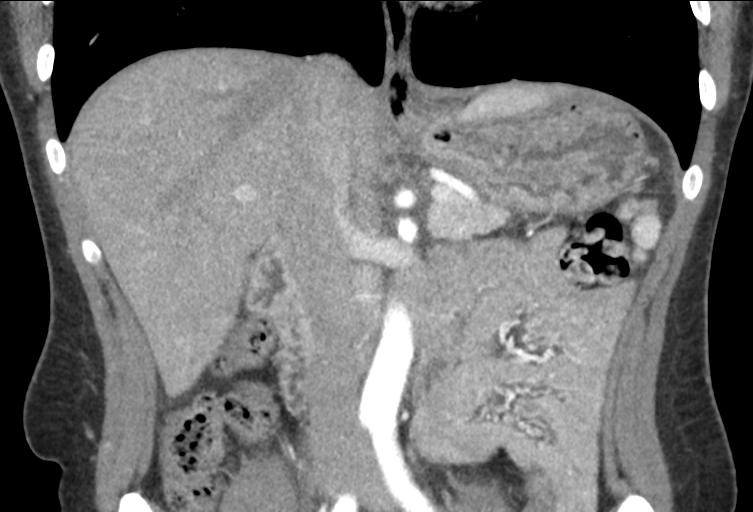
[im 63/84  soft-tissue]
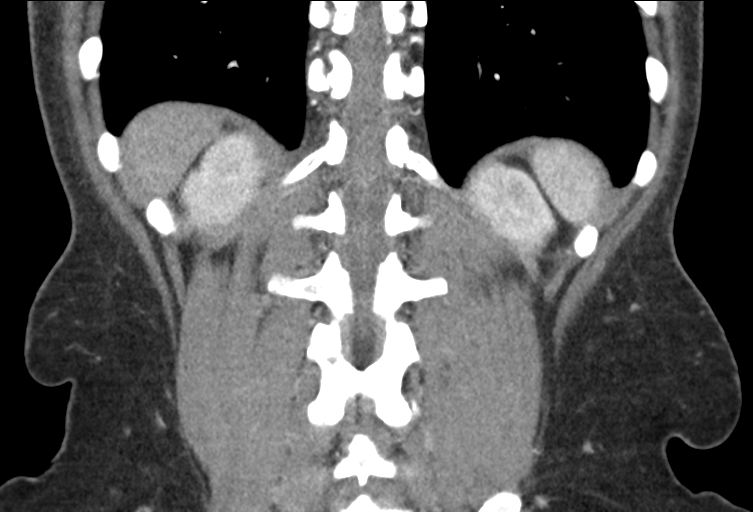

[14 of 46 positions shown; findings below may reference images not displayed]

FINDINGS: Lower chest: The lung bases are clear of acute process. No pleural
effusion or pulmonary lesions. The heart is normal in size. No
pericardial effusion. The distal esophagus and aorta are
unremarkable.

Hepatobiliary: No focal hepatic lesions or intrahepatic biliary
dilatation. The gallbladder is surgically absent. No common bile
duct dilatation.

Pancreas: No mass, inflammation or ductal dilatation.

Spleen: Normal size.  No focal lesions.

Adrenals/Urinary Tract: The adrenal glands and kidneys are
unremarkable. The bladder is normal.

Stomach/Bowel: The stomach, duodenum, small bowel and colon are
unremarkable. No acute inflammatory changes, mass lesions or
obstructive findings. The terminal ileum and appendix are normal.

Vascular/Lymphatic: The aorta is normal in caliber. No dissection.
The branch vessels are patent. The major venous structures are
patent. No mesenteric or retroperitoneal mass or adenopathy. Small
scattered lymph nodes are noted. A circumaortic left renal vein is
noted.

Reproductive: The uterus and ovaries are normal.

Other: Small amount of simple appearing free pelvic fluid, likely
physiologic.

Musculoskeletal: No significant bony findings.
IMPRESSION: 1. No acute abdominal/pelvic findings, mass lesions or adenopathy.
2. Status post cholecystectomy.  No biliary dilatation.
3. Small amount of free pelvic fluid, likely physiologic.
